# Patient Record
Sex: Male | Born: 1970 | Race: White | Hispanic: No | Marital: Married | Smoking: Never smoker
Health system: Southern US, Community
[De-identification: ages and names within clinical notes are randomized; demographics above are authoritative.]

## PROBLEM LIST (undated history)

## (undated) DIAGNOSIS — M25529 Pain in unspecified elbow: Secondary | ICD-10-CM

## (undated) DIAGNOSIS — E781 Pure hyperglyceridemia: Secondary | ICD-10-CM

## (undated) DIAGNOSIS — B019 Varicella without complication: Secondary | ICD-10-CM

## (undated) DIAGNOSIS — U071 COVID-19: Secondary | ICD-10-CM

## (undated) DIAGNOSIS — R519 Headache, unspecified: Secondary | ICD-10-CM

## (undated) DIAGNOSIS — G43909 Migraine, unspecified, not intractable, without status migrainosus: Secondary | ICD-10-CM

## (undated) DIAGNOSIS — M1990 Unspecified osteoarthritis, unspecified site: Secondary | ICD-10-CM

## (undated) DIAGNOSIS — R51 Headache: Secondary | ICD-10-CM

## (undated) HISTORY — PX: COLONOSCOPY: SHX174

## (undated) HISTORY — DX: Unspecified osteoarthritis, unspecified site: M19.90

## (undated) HISTORY — DX: Migraine, unspecified, not intractable, without status migrainosus: G43.909

## (undated) HISTORY — DX: Pain in unspecified elbow: M25.529

## (undated) HISTORY — DX: Headache: R51

## (undated) HISTORY — DX: Varicella without complication: B01.9

## (undated) HISTORY — DX: Headache, unspecified: R51.9

## (undated) HISTORY — DX: Pure hyperglyceridemia: E78.1

---

## 2007-10-29 ENCOUNTER — Ambulatory Visit: Payer: Self-pay | Admitting: Internal Medicine

## 2013-04-03 ENCOUNTER — Ambulatory Visit: Payer: Self-pay

## 2014-05-22 ENCOUNTER — Ambulatory Visit (INDEPENDENT_AMBULATORY_CARE_PROVIDER_SITE_OTHER): Payer: Commercial Managed Care - PPO | Admitting: Primary Care

## 2014-05-22 ENCOUNTER — Encounter: Payer: Self-pay | Admitting: Primary Care

## 2014-05-22 VITALS — BP 128/72 | HR 72 | Temp 98.1°F | Ht 70.5 in | Wt 236.4 lb

## 2014-05-22 DIAGNOSIS — G40909 Epilepsy, unspecified, not intractable, without status epilepticus: Secondary | ICD-10-CM

## 2014-05-22 DIAGNOSIS — E669 Obesity, unspecified: Secondary | ICD-10-CM | POA: Diagnosis not present

## 2014-05-22 DIAGNOSIS — Z86010 Personal history of colonic polyps: Secondary | ICD-10-CM | POA: Diagnosis not present

## 2014-05-22 HISTORY — DX: Epilepsy, unspecified, not intractable, without status epilepticus: G40.909

## 2014-05-22 LAB — COMPREHENSIVE METABOLIC PANEL
ALT: 39 U/L (ref 0–53)
AST: 20 U/L (ref 0–37)
Albumin: 4 g/dL (ref 3.5–5.2)
Alkaline Phosphatase: 58 U/L (ref 39–117)
BILIRUBIN TOTAL: 0.4 mg/dL (ref 0.2–1.2)
BUN: 14 mg/dL (ref 6–23)
CHLORIDE: 105 meq/L (ref 96–112)
CO2: 29 mEq/L (ref 19–32)
Calcium: 8.9 mg/dL (ref 8.4–10.5)
Creatinine, Ser: 0.91 mg/dL (ref 0.40–1.50)
GFR: 95.56 mL/min (ref >60.00)
GLUCOSE: 101 mg/dL — AB (ref 70–99)
POTASSIUM: 3.7 meq/L (ref 3.5–5.1)
Sodium: 138 mEq/L (ref 135–145)
Total Protein: 6.9 g/dL (ref 6.0–8.3)

## 2014-05-22 NOTE — Patient Instructions (Addendum)
Complete lab work prior to leaving today. I will notify you of your results. I will send refills to your pharmacy once I receive these results today. Let me know if the clotrimazole cream does not help to heal your rash. It was a pleasure to meet you today! Please don't hesitate to call me with any questions. Welcome to [facility]!

## 2014-05-22 NOTE — Assessment & Plan Note (Addendum)
Endorses mostly healthy diet. Recently started exercising again at the gym. Will obtain old records regarding lipid panel, etc.

## 2014-05-22 NOTE — Progress Notes (Signed)
Pre visit review using our clinic review tool, if applicable. No additional management support is needed unless otherwise documented below in the visit note. 

## 2014-05-22 NOTE — Progress Notes (Signed)
Subjective:    Patient ID: Vincent Padilla, male    DOB: Dec 01, 1970, 44 y.o.   MRN: [id number]  HPI  Vincent Padilla is a 44 year old male who presents today to establish care and discuss the problems mentioned below. Will obtain old records.  Was established with Dr. Derenda Mis but cannot get into his office for an appointment. He was seen in his office two months ago but reports he didn't have blood   1) Seizure disorder: Diagnosed since 1986. Last seizure was May 30th of 1997. He's maintained on carbamazepine 200 mg three times daily. This dose has never been adjusted. He was once trialed to wean off in 1992 but was unsuccessful in that his seizures returned. He is out of his medication and is requesting refills.  2) Frequent headaches: Present twice weekly, during stress, takes nothing usually but will take ibuprofen if the pain becomes intense.  3) Polyps: Last colonoscopy 2014, 5 present, removed and were benign. Due for repeat in 5 years, 2019. Denies bloody stools, pain.  4) Obesity: Exercising at the gym 2 days a week, started back recently. Diet consists of occasional juicing, grilled meats, fresh fruits and vegetables, some sweets. Drinks mostly water, juice, sodas.  5) Rash: Present to left arm, posterior trunk, and neck intermittently for a couple of months, does not itch and is not painful. He has purchased Clotrimazole OTC but has not started.   Review of Systems  Constitutional: Negative for fatigue and unexpected weight change.  Respiratory: Negative for shortness of breath.   Cardiovascular: Negative for chest pain.  Gastrointestinal: Negative for abdominal pain, diarrhea, constipation and blood in stool.  Genitourinary: Negative for dysuria and frequency.  Musculoskeletal: Negative for myalgias and arthralgias.  Skin: Positive for rash.  Allergic/Immunologic: Negative for environmental allergies.  Neurological:       Denies headaches today.  Psychiatric/Behavioral:   Denies concerns for anxiety or depression       Past Medical History  Diagnosis Date  . Migraine   . Frequent headaches   . Chicken pox   . Arthritis     Present to neck    History   Social History  . Marital Status: Married    Spouse Name: N/A  . Number of Children: N/A  . Years of Education: N/A   Occupational History  . Not on file.   Social History Main Topics  . Smoking status: Never Smoker   . Smokeless tobacco: Not on file  . Alcohol Use: No  . Drug Use: Not on file  . Sexual Activity: Not on file   Other Topics Concern  . Not on file   Social History Narrative   Married.   Works as Conservation officer, nature.   Completed some college.   Enjoys Associate Professor guns, black smithing, working on cars.       History reviewed. No pertinent past surgical history.  Family History  Problem Relation Age of Onset  . Cancer Brother 63    colon, deceased  . Cancer Paternal Grandfather   . Cancer Father     No Known Allergies  No current outpatient prescriptions on file prior to visit.   No current facility-administered medications on file prior to visit.    BP 128/72 mmHg  Pulse 72  Temp(Src) 98.1 F (36.7 C) (Oral)  Ht 5' 10.5" (1.791 m)  Wt 236 lb 6.4 oz (107.23 kg)  BMI 33.43 kg/m2  SpO2 92%    Objective:   Physical Exam  Constitutional: He is oriented to person, place, and time. He appears well-developed.  HENT:  Right Ear: Tympanic membrane and ear canal normal.  Left Ear: Tympanic membrane and ear canal normal.  Eyes: Conjunctivae and EOM are normal. Pupils are equal, round, and reactive to light.  Neck: Neck supple.  Cardiovascular: Normal rate and regular rhythm.   Pulmonary/Chest: Effort normal and breath sounds normal.  Abdominal: Soft. Bowel sounds are normal.  Lymphadenopathy:    He has no cervical adenopathy.  Neurological: He is alert and oriented to person, place, and time. He has normal reflexes. No cranial nerve deficit.  Skin: Skin is  warm and dry.  Rash present to right side of posterior trunk, left and right upper extremity. Appears to be fungal. No s/s of infection. Skin intact.  Psychiatric: He has a normal mood and affect.          Assessment & Plan:  Rash:  Appears fungal. No itching or pain. Present to posterior trunk and bilateral upper extremities. Present for several months. He has Clotrimazole OTC but has not tried.  Follow up if no improvement from Clotrimazole.

## 2014-05-22 NOTE — Assessment & Plan Note (Signed)
Brother deceased at 45 with colon cancer. Last colonoscopy 2014, removed 5 polyps, benign Follow up in 2019.

## 2014-05-22 NOTE — Assessment & Plan Note (Signed)
Managed on carbamazepine 200 mg TID since 90's. Requesting refills today. Will obtain carbamazepine blood levels and LFT's today. If normal will refill.  Will obtain old records.

## 2014-05-23 ENCOUNTER — Other Ambulatory Visit: Payer: Self-pay | Admitting: Primary Care

## 2014-05-23 DIAGNOSIS — G40909 Epilepsy, unspecified, not intractable, without status epilepticus: Secondary | ICD-10-CM

## 2014-05-23 LAB — CARBAMAZEPINE LEVEL, TOTAL: CARBAMAZEPINE LVL: 5.4 ug/mL (ref 4.0–12.0)

## 2014-05-23 MED ORDER — CARBAMAZEPINE 200 MG PO TABS: 200.0000 mg | ORAL_TABLET | Freq: Three times a day (TID) | ORAL | Status: DC

## 2014-09-01 ENCOUNTER — Encounter: Payer: Commercial Managed Care - PPO | Admitting: Primary Care

## 2014-09-04 ENCOUNTER — Encounter: Payer: Self-pay | Admitting: *Deleted

## 2014-09-04 ENCOUNTER — Other Ambulatory Visit: Payer: Self-pay | Admitting: Primary Care

## 2014-09-04 ENCOUNTER — Ambulatory Visit (INDEPENDENT_AMBULATORY_CARE_PROVIDER_SITE_OTHER): Payer: Commercial Managed Care - PPO | Admitting: Primary Care

## 2014-09-04 VITALS — BP 118/76 | HR 80 | Temp 97.5°F | Ht 71.0 in | Wt 235.8 lb

## 2014-09-04 DIAGNOSIS — G40909 Epilepsy, unspecified, not intractable, without status epilepticus: Secondary | ICD-10-CM | POA: Diagnosis not present

## 2014-09-04 DIAGNOSIS — E781 Pure hyperglyceridemia: Secondary | ICD-10-CM

## 2014-09-04 DIAGNOSIS — R7989 Other specified abnormal findings of blood chemistry: Secondary | ICD-10-CM | POA: Diagnosis not present

## 2014-09-04 DIAGNOSIS — E66811 Obesity, class 1: Secondary | ICD-10-CM

## 2014-09-04 DIAGNOSIS — Z0000 Encounter for general adult medical examination without abnormal findings: Secondary | ICD-10-CM | POA: Diagnosis not present

## 2014-09-04 DIAGNOSIS — E669 Obesity, unspecified: Secondary | ICD-10-CM

## 2014-09-04 LAB — CBC
HCT: 44.4 % (ref 39.0–52.0)
Hemoglobin: 15.4 g/dL (ref 13.0–17.0)
MCHC: 34.6 g/dL (ref 30.0–36.0)
MCV: 88.3 fl (ref 78.0–100.0)
Platelets: 218 10*3/uL (ref 150.0–400.0)
RBC: 5.03 Mil/uL (ref 4.22–5.81)
RDW: 12.7 % (ref 11.5–15.5)
WBC: 5.9 10*3/uL (ref 4.0–10.5)

## 2014-09-04 LAB — COMPREHENSIVE METABOLIC PANEL
ALT: 28 U/L (ref 0–53)
AST: 19 U/L (ref 0–37)
Albumin: 4.2 g/dL (ref 3.5–5.2)
Alkaline Phosphatase: 57 U/L (ref 39–117)
BUN: 14 mg/dL (ref 6–23)
CO2: 30 meq/L (ref 19–32)
CREATININE: 1.02 mg/dL (ref 0.40–1.50)
Calcium: 9.1 mg/dL (ref 8.4–10.5)
Chloride: 105 mEq/L (ref 96–112)
GFR: 83.66 mL/min (ref >60.00)
Glucose, Bld: 106 mg/dL — ABNORMAL HIGH (ref 70–99)
Potassium: 4.1 mEq/L (ref 3.5–5.1)
Sodium: 142 mEq/L (ref 135–145)
Total Bilirubin: 0.4 mg/dL (ref 0.2–1.2)
Total Protein: 7 g/dL (ref 6.0–8.3)

## 2014-09-04 LAB — LDL CHOLESTEROL, DIRECT: Direct LDL: 41 mg/dL

## 2014-09-04 LAB — LIPID PANEL
Cholesterol: 183 mg/dL (ref 0–200)
HDL: 27.3 mg/dL — ABNORMAL LOW (ref >39.00)
Total CHOL/HDL Ratio: 7
Triglycerides: 607 mg/dL — ABNORMAL HIGH (ref 0.0–149.0)

## 2014-09-04 LAB — HEMOGLOBIN A1C: Hgb A1c MFr Bld: 5.1 % (ref 4.6–6.5)

## 2014-09-04 LAB — TSH: TSH: 1.51 u[IU]/mL (ref 0.35–4.50)

## 2014-09-04 NOTE — Assessment & Plan Note (Signed)
Discussed the importance of healthy diet and exercise. Will do labs today. Suggestions provided to patient regarding healthy food choices.

## 2014-09-04 NOTE — Assessment & Plan Note (Signed)
Stable. Last carbamazepine level drawn in May 2016 and normal.  Managed on carbamazepine 200 mg. Will continue to monitor.

## 2014-09-04 NOTE — Progress Notes (Signed)
Pre visit review using our clinic review tool, if applicable. No additional management support is needed unless otherwise documented below in the visit note. 

## 2014-09-04 NOTE — Progress Notes (Signed)
Subjective:    Patient ID: Vincent Padilla, male    DOB: 30-Aug-1970, 44 y.o.   MRN: [id number]  HPI  Vincent Padilla is a 44 year old male who presents today for complete physical.  Immunizations: -Tetanus: Believes it's been within 10 years. -Influenza: Did not receive last season.   Diet:  Breakfast: Cereal, oatmeal Lunch: Skips some, fast food (taco bell, chicken sandwich) Dinner: Spaghetti, casseroles, sandwiches Desserts: Ice cream Beverages: Gatorade, water, occasional soda Exercise: Active lifestyle. Exercising at the gym 2-3 times weekly 1 hour some weeks. Eye exam: Due, plans to schedule.  Dental exam: Unknown when last completed.  Colonoscopy: Completed in 04-21-12 with colonic polyps, due again in 21-Apr-2017. Brother deceased due to colon cancer.  1) Seizure disorder: Managed on carbamazepine 200 mg for many years. Denies recent seizure.   Review of Systems  Constitutional: Negative for unexpected weight change.  HENT: Negative for rhinorrhea.   Respiratory: Negative for cough and shortness of breath.   Cardiovascular: Negative for chest pain.  Gastrointestinal: Negative for diarrhea and constipation.  Genitourinary: Negative for difficulty urinating.  Musculoskeletal: Negative for myalgias and arthralgias.  Skin: Negative for rash.  Neurological: Negative for dizziness, numbness and headaches.  Psychiatric/Behavioral:       Denies concerns for anxiety or depression       Past Medical History  Diagnosis Date  . Migraine   . Frequent headaches   . Chicken pox   . Arthritis     Present to neck    Social History   Social History  . Marital Status: Married    Spouse Name: N/A  . Number of Children: N/A  . Years of Education: N/A   Occupational History  . Not on file.   Social History Main Topics  . Smoking status: Never Smoker   . Smokeless tobacco: Not on file  . Alcohol Use: No  . Drug Use: Not on file  . Sexual Activity: Not on file   Other Topics  Concern  . Not on file   Social History Narrative   Married.   Works as Conservation officer, nature.   Completed some college.   Enjoys Associate Professor guns, black smithing, working on cars.       No past surgical history on file.  Family History  Problem Relation Age of Onset  . Cancer Brother 79    colon, deceased  . Cancer Paternal Grandfather   . Cancer Father     No Known Allergies  Current Outpatient Prescriptions on File Prior to Visit  Medication Sig Dispense Refill  . carbamazepine (TEGRETOL) 200 MG tablet Take 1 tablet (200 mg total) by mouth 3 (three) times daily. 90 tablet 5   No current facility-administered medications on file prior to visit.    BP 118/76 mmHg  Pulse 80  Temp(Src) 97.5 F (36.4 C) (Oral)  Ht 5\' 11"  (1.803 m)  Wt 235 lb 12.8 oz (106.958 kg)  BMI 32.90 kg/m2  SpO2 94%    Objective:   Physical Exam  Constitutional: He is oriented to person, place, and time. He appears well-nourished.  HENT:  Right Ear: Tympanic membrane and ear canal normal.  Left Ear: Tympanic membrane and ear canal normal.  Nose: Nose normal.  Mouth/Throat: Oropharynx is clear and moist.  Eyes: Conjunctivae and EOM are normal. Pupils are equal, round, and reactive to light.  Neck: Neck supple.  Cardiovascular: Normal rate and regular rhythm.   Pulmonary/Chest: Effort normal and breath sounds normal.  Abdominal: Soft.  Bowel sounds are normal. There is no tenderness.  Musculoskeletal: Normal range of motion.  Lymphadenopathy:    He has no cervical adenopathy.  Neurological: He is alert and oriented to person, place, and time. He has normal reflexes. No cranial nerve deficit.  Skin: Skin is warm and dry.  Psychiatric: He has a normal mood and affect.          Assessment & Plan:

## 2014-09-04 NOTE — Assessment & Plan Note (Signed)
Tetanus up to date per patient. Nothing in [facility]. Labs today and pending. Exam unremarkable. Discussed the importance of healthy diet and regular exercise. Will schedule eye and dental exams. Follow up in 1 year for repeat physical

## 2014-09-04 NOTE — Patient Instructions (Signed)
Complete lab work prior to leaving today. I will notify you of your results.  It is important that you improve your diet. Please limit carbohydrates in the form of white bread, rice, pasta, cakes, cookies, sugary drinks, etc. Increase your consumption of fresh fruits and vegetables. Be sure to drink plenty of water daily.  You should be getting 1 hour of moderate intensity exercise 5 days a week.   Follow up in 1 year for repeat physical or sooner if needed.  It was a pleasure to see you today!

## 2014-12-04 ENCOUNTER — Other Ambulatory Visit (INDEPENDENT_AMBULATORY_CARE_PROVIDER_SITE_OTHER): Payer: Commercial Managed Care - PPO

## 2014-12-04 DIAGNOSIS — E781 Pure hyperglyceridemia: Secondary | ICD-10-CM

## 2014-12-04 LAB — LIPID PANEL
CHOL/HDL RATIO: 8
CHOLESTEROL: 214 mg/dL — AB (ref 0–200)
HDL: 26.1 mg/dL — AB (ref >39.00)
Triglycerides: 1309 mg/dL — ABNORMAL HIGH (ref 0.0–149.0)

## 2014-12-04 LAB — LDL CHOLESTEROL, DIRECT: Direct LDL: 44 mg/dL

## 2014-12-07 ENCOUNTER — Encounter: Payer: Self-pay | Admitting: *Deleted

## 2014-12-07 ENCOUNTER — Encounter: Payer: Self-pay | Admitting: Primary Care

## 2014-12-07 ENCOUNTER — Other Ambulatory Visit: Payer: Self-pay | Admitting: Primary Care

## 2014-12-07 DIAGNOSIS — E781 Pure hyperglyceridemia: Secondary | ICD-10-CM

## 2014-12-07 MED ORDER — EZETIMIBE 10 MG PO TABS: 10.0000 mg | ORAL_TABLET | Freq: Every day | ORAL | Status: DC

## 2015-02-15 ENCOUNTER — Other Ambulatory Visit: Payer: Self-pay | Admitting: Primary Care

## 2015-02-15 NOTE — Telephone Encounter (Signed)
Electronically refill request for   carbamazepine (TEGRETOL) 200 MG tablet   Take 1 tablet (200 mg total) by mouth 3 (three) times daily.  Dispense: 90 tablet   Refills: 5     Last prescribed on 05/23/2014. Last seen on 09/04/2014. CPE appt on 09/07/2015.

## 2015-09-07 ENCOUNTER — Telehealth: Payer: Self-pay | Admitting: Primary Care

## 2015-09-07 ENCOUNTER — Encounter: Payer: Commercial Managed Care - PPO | Admitting: Primary Care

## 2015-09-07 NOTE — Telephone Encounter (Signed)
Left message for pt to return call to rs appt

## 2015-09-07 NOTE — Telephone Encounter (Signed)
Please reschedule at his convenience 

## 2015-09-07 NOTE — Telephone Encounter (Signed)
Patient did not come in for their appointment today for cpe.  Please let me know if patient needs to be contacted immediately for follow up or no follow up needed. °

## 2015-09-10 NOTE — Telephone Encounter (Signed)
Pt can not reschedule appt for cpe, has no insurance at the time

## 2016-06-08 ENCOUNTER — Encounter: Payer: Self-pay | Admitting: Primary Care

## 2016-06-08 ENCOUNTER — Ambulatory Visit (INDEPENDENT_AMBULATORY_CARE_PROVIDER_SITE_OTHER): Payer: Self-pay | Admitting: Primary Care

## 2016-06-08 VITALS — BP 122/76 | HR 58 | Temp 98.4°F | Ht 70.5 in | Wt 204.1 lb

## 2016-06-08 DIAGNOSIS — E785 Hyperlipidemia, unspecified: Secondary | ICD-10-CM

## 2016-06-08 DIAGNOSIS — G40909 Epilepsy, unspecified, not intractable, without status epilepticus: Secondary | ICD-10-CM

## 2016-06-08 DIAGNOSIS — E781 Pure hyperglyceridemia: Secondary | ICD-10-CM | POA: Insufficient documentation

## 2016-06-08 HISTORY — DX: Hyperlipidemia, unspecified: E78.5

## 2016-06-08 LAB — LIPID PANEL
CHOLESTEROL: 192 mg/dL (ref 0–200)
HDL: 38.8 mg/dL — AB (ref >39.00)
LDL Cholesterol: 134 mg/dL — ABNORMAL HIGH (ref 0–99)
NonHDL: 153.12
TRIGLYCERIDES: 98 mg/dL (ref 0.0–149.0)
Total CHOL/HDL Ratio: 5
VLDL: 19.6 mg/dL (ref 0.0–40.0)

## 2016-06-08 LAB — COMPREHENSIVE METABOLIC PANEL
ALBUMIN: 4.4 g/dL (ref 3.5–5.2)
ALK PHOS: 56 U/L (ref 39–117)
ALT: 25 U/L (ref 0–53)
AST: 17 U/L (ref 0–37)
BUN: 17 mg/dL (ref 6–23)
CALCIUM: 8.9 mg/dL (ref 8.4–10.5)
CHLORIDE: 108 meq/L (ref 96–112)
CO2: 25 mEq/L (ref 19–32)
Creatinine, Ser: 0.85 mg/dL (ref 0.40–1.50)
GFR: 102.46 mL/min (ref >60.00)
Glucose, Bld: 114 mg/dL — ABNORMAL HIGH (ref 70–99)
POTASSIUM: 3.8 meq/L (ref 3.5–5.1)
Sodium: 139 mEq/L (ref 135–145)
TOTAL PROTEIN: 6.8 g/dL (ref 6.0–8.3)
Total Bilirubin: 0.4 mg/dL (ref 0.2–1.2)

## 2016-06-08 MED ORDER — CARBAMAZEPINE 200 MG PO TABS
200.0000 mg | ORAL_TABLET | Freq: Three times a day (TID) | ORAL | 3 refills | Status: DC
Start: 2016-06-08 — End: 2017-12-03

## 2016-06-08 NOTE — Progress Notes (Signed)
Subjective:    Patient ID: Vincent Padilla, male    DOB: 11-09-1970, 46 y.o.   MRN: [id number]  HPI  Vincent Padilla is a 46 year old male who presents today for medication refill.  1) Seizure Disorder: History of seizure disorder and is currently managed on carbamazepine 200 mg for which he's taken since 1986. His last seizure was May 30th of 1997 when he attempted to wean off of his medication in the past. He is needing are refill of his medication today.   He denies seizures, dizziness. He ran out of his medication in mid 2017 and has been taking his wife's medication since.   2) Hyperlipidemia: Last evaluation in our office was in November 2016. TC of 214, Trigs at 1309. He was encouraged to start Zetia and return to our office for re-evaluation. He never picked up the Zetia as it was too expensive and has not been back for re-evaluation since.  He's been taking Krill [employer] and has changed his diet drastically since January 2018. He's been fasting during the day, has cut back on sodas, cut back on sugar, and eats less junk food.   Wt Readings from Last 3 Encounters:  06/08/16 204 lb 1.9 oz (92.6 kg)  09/04/14 235 lb 12.8 oz (107 kg)  05/22/14 236 lb 6.4 oz (107.2 kg)     Review of Systems  Constitutional: Negative for fatigue.  Eyes: Negative for visual disturbance.  Respiratory: Negative for shortness of breath.   Cardiovascular: Negative for chest pain and leg swelling.  Neurological: Negative for dizziness, seizures, weakness and headaches.       Past Medical History:  Diagnosis Date  . Arthritis    Present to neck  . Chicken pox   . Frequent headaches   . Hypertriglyceridemia   . Migraine      Social History   Social History  . Marital status: Married    Spouse name: N/A  . Number of children: N/A  . Years of education: N/A   Occupational History  . Not on file.   Social History Main Topics  . Smoking status: Never Smoker  . Smokeless tobacco: Never Used    . Alcohol use No  . Drug use: Unknown  . Sexual activity: Not on file   Other Topics Concern  . Not on file   Social History Narrative   Married.   Works as Conservation officer, nature.   Completed some college.   Enjoys Associate Professor guns, black smithing, working on cars.       No past surgical history on file.  Family History  Problem Relation Age of Onset  . Cancer Brother 10       colon, deceased  . Cancer Paternal Grandfather   . Cancer Father     No Known Allergies  No current outpatient prescriptions on file prior to visit.   No current facility-administered medications on file prior to visit.     BP 122/76   Pulse (!) 58   Temp 98.4 F (36.9 C) (Oral)   Ht 5' 10.5" (1.791 m)   Wt 204 lb 1.9 oz (92.6 kg)   SpO2 97%   BMI 28.87 kg/m    Objective:   Physical Exam  Constitutional: He is oriented to person, place, and time. He appears well-nourished.  Eyes: EOM are normal. Pupils are equal, round, and reactive to light.  Cardiovascular: Normal rate and regular rhythm.   Pulmonary/Chest: Effort normal and breath sounds normal.  Neurological: He  is alert and oriented to person, place, and time. He has normal reflexes. No cranial nerve deficit. Coordination normal.          Assessment & Plan:

## 2016-06-08 NOTE — Patient Instructions (Signed)
Complete lab work prior to leaving today. I will notify you of your results once received.   I sent refills of your medication to your pharmacy.  Continue to work on improvements in your diet.   Start exercising. You should be getting 150 minutes of moderate intensity exercise weekly.  It was a pleasure to see you today!  Food Choices to Lower Your Triglycerides Triglycerides are a type of fat in your blood. High levels of triglycerides can increase the risk of heart disease and stroke. If your triglyceride levels are high, the foods you eat and your eating habits are very important. Choosing the right foods can help lower your triglycerides. What general guidelines do I need to follow?  Lose weight if you are overweight.  Limit or avoid alcohol.  Fill one half of your plate with vegetables and green salads.  Limit fruit to two servings a day. Choose fruit instead of juice.  Make one fourth of your plate whole grains. Look for the word "whole" as the first word in the ingredient list.  Fill one fourth of your plate with lean protein foods.  Enjoy fatty fish (such as salmon, mackerel, sardines, and tuna) three times a week.  Choose healthy fats.  Limit foods high in starch and sugar.  Eat more home-cooked food and less restaurant, buffet, and fast food.  Limit fried foods.  Cook foods using methods other than frying.  Limit saturated fats.  Check ingredient lists to avoid foods with partially hydrogenated oils (trans fats) in them. What foods can I eat? Grains  Whole grains, such as whole wheat or whole grain breads, crackers, cereals, and pasta. Unsweetened oatmeal, bulgur, barley, quinoa, or brown rice. Corn or whole wheat flour tortillas. Vegetables  Fresh or frozen vegetables (raw, steamed, roasted, or grilled). Green salads. Fruits  All fresh, canned (in natural juice), or frozen fruits. Meat and Other Protein Products  Ground beef (85% or leaner), grass-fed beef,  or beef trimmed of fat. Skinless chicken or [place]. Ground chicken or [place]. Pork trimmed of fat. All fish and seafood. Eggs. Dried beans, peas, or lentils. Unsalted nuts or seeds. Unsalted canned or dry beans. Dairy  Low-fat dairy products, such as skim or 1% milk, 2% or reduced-fat cheeses, low-fat ricotta or cottage cheese, or plain low-fat yogurt. Fats and Oils  Tub margarines without trans fats. Light or reduced-fat mayonnaise and salad dressings. Avocado. Safflower, olive, or canola oils. Natural peanut or almond butter. The items listed above may not be a complete list of recommended foods or beverages. Contact your dietitian for more options.  What foods are not recommended? Grains  White bread. White pasta. White rice. Cornbread. Bagels, pastries, and croissants. Crackers that contain trans fat. Vegetables  White potatoes. Corn. Creamed or fried vegetables. Vegetables in a cheese sauce. Fruits  Dried fruits. Canned fruit in light or heavy syrup. Fruit juice. Meat and Other Protein Products  Fatty cuts of meat. Ribs, chicken wings, bacon, sausage, bologna, salami, chitterlings, fatback, hot dogs, bratwurst, and packaged luncheon meats. Dairy  Whole or 2% milk, cream, half-and-half, and cream cheese. Whole-fat or sweetened yogurt. Full-fat cheeses. Nondairy creamers and whipped toppings. Processed cheese, cheese spreads, or cheese curds. Sweets and Desserts  Corn syrup, sugars, honey, and molasses. Candy. Jam and jelly. Syrup. Sweetened cereals. Cookies, pies, cakes, donuts, muffins, and ice cream. Fats and Oils  Butter, stick margarine, lard, shortening, ghee, or bacon fat. Coconut, palm kernel, or palm oils. Beverages  Alcohol. Sweetened drinks (such  as sodas, lemonade, and fruit drinks or punches). The items listed above may not be a complete list of foods and beverages to avoid. Contact your dietitian for more information.  This information is not intended to replace advice given  to you by your health care provider. Make sure you discuss any questions you have with your health care provider. Document Released: 10/21/2003 Document Revised: 06/10/2015 Document Reviewed: 11/06/2012 Elsevier Interactive Patient Education  2017 [employer].

## 2016-06-08 NOTE — Assessment & Plan Note (Signed)
History of hypertriglyceridemia that was discovered in 2016, never followed up as recommended. Will repeat lipids today as he is fasting. Consider fenofibrate vs statin for treatment if necessary. Commended him on weight loss through improvements in lifestyle.

## 2016-06-08 NOTE — Assessment & Plan Note (Signed)
Check Carbamezapine level today.  Refills sent to pharmacy.

## 2016-06-09 ENCOUNTER — Encounter: Payer: Self-pay | Admitting: *Deleted

## 2016-06-09 ENCOUNTER — Other Ambulatory Visit: Payer: Self-pay | Admitting: Primary Care

## 2016-06-09 DIAGNOSIS — R739 Hyperglycemia, unspecified: Secondary | ICD-10-CM

## 2016-06-09 LAB — CARBAMAZEPINE LEVEL, TOTAL: Carbamazepine Lvl: 4.1 mg/L (ref 4.0–12.0)

## 2016-06-13 ENCOUNTER — Other Ambulatory Visit (INDEPENDENT_AMBULATORY_CARE_PROVIDER_SITE_OTHER): Payer: Self-pay

## 2016-06-13 DIAGNOSIS — R739 Hyperglycemia, unspecified: Secondary | ICD-10-CM

## 2016-06-13 LAB — HEMOGLOBIN A1C: HEMOGLOBIN A1C: 5.5 % (ref 4.6–6.5)

## 2017-10-04 ENCOUNTER — Encounter: Payer: Self-pay | Admitting: Family Medicine

## 2017-10-04 ENCOUNTER — Ambulatory Visit: Payer: Self-pay | Admitting: Family Medicine

## 2017-10-04 VITALS — BP 132/72 | HR 59 | Temp 98.3°F | Wt 230.8 lb

## 2017-10-04 DIAGNOSIS — M25529 Pain in unspecified elbow: Secondary | ICD-10-CM

## 2017-10-04 NOTE — Patient Instructions (Signed)
Keep icing and take ibuprofen with food. 2 tabs up to 3 times a day.  Get a tennis elbow strap and use that.  If not better, then ask about seeing Dr. Lorelei Pont. Take care.  Glad to see you.

## 2017-10-04 NOTE — Progress Notes (Signed)
Was lifting/moving a transmission.  Then had elbow pain after that.  This was about 1 month ago.  Can still move L elbow.  Initially with antecubital area pain but now with pain near the lateral epicondyle.  He is L handed.  Some ache at rest, worse pain with lifting.  Pain with pronation and supination.  No specific injury o/w, wasn't hit/pinned.    Has been taking ibuprofen episodically.   Had used an elbow strap but not a tennis elbow strap.  Has been icing, with less relief now.    Meds, vitals, and allergies reviewed.   ROS: Per HPI unless specifically indicated in ROS section   nad ncat Normal left shoulder elbow and wrist range of motion.  Normal grip.  Distally neurovascular intact with normal sensation and normal radial pulse.  He is not tender on the olecranon, antecubital area, medial epicondyle.  He has minimal tenderness near the lateral epicondyles.  He thought his lower level of pain today on exam was related to limiting lifting yesterday.  No bruising.  No rash.

## 2017-10-05 DIAGNOSIS — M25529 Pain in unspecified elbow: Secondary | ICD-10-CM | POA: Insufficient documentation

## 2017-10-05 HISTORY — DX: Pain in unspecified elbow: M25.529

## 2017-10-05 NOTE — Assessment & Plan Note (Signed)
Unlikely to have a fracture and unlikely to benefit from imaging at this point.  Discussed with patient about options.  Likely tendinitis related to overuse/strain.  He can still try a tennis elbow strap as this may be useful.  Continue icing along with using tennis elbow strap.  Take ibuprofen with routine cautions.  If not better I would like him to see Dr. Lorelei Pont.  Rationale discussed with patient.  He agrees.

## 2017-10-25 ENCOUNTER — Encounter: Payer: Self-pay | Admitting: Family Medicine

## 2017-10-25 ENCOUNTER — Ambulatory Visit: Payer: Self-pay | Admitting: Family Medicine

## 2017-10-25 VITALS — BP 104/72 | HR 65 | Temp 98.4°F | Ht 70.25 in | Wt 233.0 lb

## 2017-10-25 DIAGNOSIS — M7918 Myalgia, other site: Secondary | ICD-10-CM

## 2017-10-25 MED ORDER — PREDNISONE 20 MG PO TABS: ORAL_TABLET | ORAL | 0 refills | Status: DC

## 2017-10-25 NOTE — Progress Notes (Signed)
.tem   Dr. Frederico Hamman T. Kaitlan Bin, MD, [facility] [place], [address] Phone: [phone] Fax: [phone]  10/25/2017  Patient: Vincent Padilla, MRN: [id number], DOB: January 02, 1971, 47 y.o.  Primary Physician:  [name], NP   Chief Complaint  Patient presents with  . Elbow Pain    Left   Subjective:   KAEVON COTTA is a 47 y.o. very pleasant male patient who presents with the following:  May was lifting some paver blocks in May 2019. Was doing some lifting and it really hurt a lot in the antecubital fossa. Neoprene brace did help some and arm hurting a lot. Saw Dr. Keturah Barre and tried a tennis elbow brace - this did not help. It made it worse.   His pain is deep in the antecubital fossa and has no pain at the LE or ME. No numbness or tingling.  Supination hurts a lot. Also deep antecubital fossa.   Mountain climbers elbow Supinator syndrome  Past Medical History, Surgical History, Social History, Family History, Problem List, Medications, and Allergies have been reviewed and updated if relevant.  Patient Active Problem List   Diagnosis Date Noted  . Elbow pain 10/05/2017  . Hyperlipidemia 06/08/2016  . Preventative health care 09/04/2014  . Seizure disorder ([place]) 05/22/2014  . History of colonic polyps 05/22/2014  . Obesity (BMI 30.0-34.9) 05/22/2014    Past Medical History:  Diagnosis Date  . Arthritis    Present to neck  . Chicken pox   . Frequent headaches   . Hypertriglyceridemia   . Migraine     History reviewed. No pertinent surgical history.  Social History   Socioeconomic History  . Marital status: Married    Spouse name: Not on file  . Number of children: Not on file  . Years of education: Not on file  . Highest education level: Not on file  Occupational History  . Not on file  Social Needs  . Financial resource strain: Not on file  . Food insecurity:    Worry: Not on file   Inability: Not on file  . Transportation needs:    Medical: Not on file    Non-medical: Not on file  Tobacco Use  . Smoking status: Never Smoker  . Smokeless tobacco: Never Used  Substance and Sexual Activity  . Alcohol use: No    Alcohol/week: 0.0 standard drinks  . Drug use: Not on file  . Sexual activity: Not on file  Lifestyle  . Physical activity:    Days per week: Not on file    Minutes per session: Not on file  . Stress: Not on file  Relationships  . Social connections:    Talks on phone: Not on file    Gets together: Not on file    Attends religious service: Not on file    Active member of club or organization: Not on file    Attends meetings of clubs or organizations: Not on file    Relationship status: Not on file  . Intimate partner violence:    Fear of current or ex partner: Not on file    Emotionally abused: Not on file    Physically abused: Not on file    Forced sexual activity: Not on file  Other Topics Concern  . Not on file  Social History Narrative   Married.   Works as Conservation officer, nature.   Completed some college.   Enjoys Designer, fashion/clothing, black  smithing, working on cars.    Family History  Problem Relation Age of Onset  . Cancer Brother 27       colon, deceased  . Cancer Paternal Grandfather   . Cancer Father     No Known Allergies  Medication list reviewed and updated in full in [place].  GEN: No fevers, chills. Nontoxic. Primarily MSK c/o today. MSK: Detailed in the HPI GI: tolerating PO intake without difficulty Neuro: No numbness, parasthesias, or tingling associated. Otherwise the pertinent positives of the ROS are noted above.   Objective:   BP 104/72   Pulse 65   Temp 98.4 F (36.9 C) (Oral)   Ht 5' 10.25" (1.784 m)   Wt 233 lb (105.7 kg)   BMI 33.19 kg/m    GEN: WDWN, NAD, Non-toxic, Alert & Oriented x 3 HEENT: Atraumatic, Normocephalic.  Ears and Nose: No external deformity. EXTR: No  clubbing/cyanosis/edema NEURO: Normal gait.  PSYCH: Normally interactive. Conversant. Not depressed or anxious appearing.  Calm demeanor.   L elbow Ecchymosis or edema: neg ROM: full flexion, extension, pronation, supination Shoulder ROM: Full Flexion: 5/5, but speeds is pos Extension: 5/5 Supination: 4/5 - painful Pronation: 5/5 Wrist ext: 5/5 Wrist flexion: 5/5 No gross bony abnormality Varus and Valgus stress: stable Pain deep to the biceps tendon on each side.  ECRB tenderness: neg Medial epicondyle: NT Lateral epicondyle, resisted wrist extension from wrist full pronation and flexion: NT grip: 5/5  sensation intact Tinel's, Elbow: negative   Radiology: No results found.  Assessment and Plan:   Tenderness of brachioradialis muscle  Brachialis and brachioradialis involvement no LE  C/w mountain climbers elbow. I reviewed classic mountain climbers elbow with him.  10 d oral steroid course  Follow-up: 6 weeks if still having problems  Meds ordered this encounter  Medications  . predniSONE (DELTASONE) 20 MG tablet    Sig: 2 tabs po daily for 5 days, then 1 tab po daily for 5 days    Dispense:  15 tablet    Refill:  0   Signed,  Kendryck Lacroix T. Abigail Teall, MD   Allergies as of 10/25/2017   No Known Allergies     Medication List        Accurate as of 10/25/17 11:59 PM. Always use your most recent med list.          carbamazepine 200 MG tablet Commonly known as:  TEGRETOL Take 1 tablet (200 mg total) by mouth 3 (three) times daily.   predniSONE 20 MG tablet Commonly known as:  DELTASONE 2 tabs po daily for 5 days, then 1 tab po daily for 5 days

## 2017-12-03 ENCOUNTER — Other Ambulatory Visit: Payer: Self-pay | Admitting: Primary Care

## 2017-12-03 DIAGNOSIS — G40909 Epilepsy, unspecified, not intractable, without status epilepticus: Secondary | ICD-10-CM

## 2017-12-04 NOTE — Telephone Encounter (Signed)
Please notify patient that he needs needs to be seen for either CPE or follow up for further refills. We will provide him with a 30 day supply of his medication until he's seen.

## 2017-12-04 NOTE — Telephone Encounter (Signed)
Last prescribed on 06/08/2016 Last office visit with Dr Lorelei Pont on 10/25/2017 but last saw Allie Bossier on 06/08/2016

## 2017-12-12 NOTE — Telephone Encounter (Signed)
Talked to pt and he stated he will have his wife call the office to schedule appt.

## 2018-02-08 ENCOUNTER — Encounter: Payer: Self-pay | Admitting: Primary Care

## 2018-02-08 ENCOUNTER — Ambulatory Visit (INDEPENDENT_AMBULATORY_CARE_PROVIDER_SITE_OTHER): Payer: Self-pay | Admitting: Primary Care

## 2018-02-08 VITALS — BP 124/84 | HR 56 | Temp 98.2°F | Ht 70.25 in | Wt 240.5 lb

## 2018-02-08 DIAGNOSIS — E785 Hyperlipidemia, unspecified: Secondary | ICD-10-CM

## 2018-02-08 DIAGNOSIS — G40909 Epilepsy, unspecified, not intractable, without status epilepticus: Secondary | ICD-10-CM

## 2018-02-08 LAB — COMPREHENSIVE METABOLIC PANEL
ALK PHOS: 53 U/L (ref 39–117)
ALT: 22 U/L (ref 0–53)
AST: 16 U/L (ref 0–37)
Albumin: 4.3 g/dL (ref 3.5–5.2)
BILIRUBIN TOTAL: 0.5 mg/dL (ref 0.2–1.2)
BUN: 10 mg/dL (ref 6–23)
CALCIUM: 9 mg/dL (ref 8.4–10.5)
CO2: 30 meq/L (ref 19–32)
Chloride: 104 mEq/L (ref 96–112)
Creatinine, Ser: 0.91 mg/dL (ref 0.40–1.50)
GFR: 88.48 mL/min (ref >60.00)
Glucose, Bld: 113 mg/dL — ABNORMAL HIGH (ref 70–99)
Potassium: 3.7 mEq/L (ref 3.5–5.1)
Sodium: 140 mEq/L (ref 135–145)
TOTAL PROTEIN: 6.8 g/dL (ref 6.0–8.3)

## 2018-02-08 LAB — LIPID PANEL
CHOLESTEROL: 185 mg/dL (ref 0–200)
HDL: 33.9 mg/dL — AB (ref >39.00)
NonHDL: 150.92
TRIGLYCERIDES: 349 mg/dL — AB (ref 0.0–149.0)
Total CHOL/HDL Ratio: 5
VLDL: 69.8 mg/dL — ABNORMAL HIGH (ref 0.0–40.0)

## 2018-02-08 LAB — LDL CHOLESTEROL, DIRECT: Direct LDL: 68 mg/dL

## 2018-02-08 LAB — HEMOGLOBIN A1C: Hgb A1c MFr Bld: 5 % (ref 4.6–6.5)

## 2018-02-08 NOTE — Assessment & Plan Note (Signed)
Has regained most of his weight back since last visit. Discussed the importance of a healthy diet and regular exercise in order for weight loss, and to reduce the risk of any potential medical problems. Repeat lipids, A1C pending.

## 2018-02-08 NOTE — Progress Notes (Signed)
Subjective:    Patient ID: Vincent Padilla, male    DOB: December 06, 1970, 48 y.o.   MRN: [id number]  HPI  Vincent Padilla is a 48 year old male who presents today for medication refill.  1) Seizure Disorder: Currently managed on carbamazepine 200 mg for which he's taken since 1992. Last seizure was May 30th 1997 when he attempted to wean off of his medication.   Since his last visit he's missed doses of his medications at times. He has mostly been consistent with compliance.   2) Hyperlipidemia: Prior history of hypertriglyceridemia with trigs as high as 1300. Last lipid panel was in May 2018 with trigs of 98. During that time he had drastically changed his diet and was successful with weight loss.   Since his last visit he's gained weight, endorses a fair diet. He's no longer taking fish oil.   Diet currently consists of:  Breakfast: Skips Lunch: Skips sometimes, take out food Dinner: Chili, meat, vegetables, fast food  Snacks: None Desserts: 2-3 days weekly, smaller portions  Beverages: Water, some soda, occasional sweet tea  Exercise: He is not exercising, active at work   [facility] Readings from Last 3 Encounters:  02/08/18 240 lb 8 oz (109.1 kg)  10/25/17 233 lb (105.7 kg)  10/04/17 230 lb 12 oz (104.7 kg)   BP Readings from Last 3 Encounters:  02/08/18 124/84  10/25/17 104/72  10/04/17 132/72     Review of Systems  Eyes: Negative for visual disturbance.  Respiratory: Negative for shortness of breath.   Cardiovascular: Negative for chest pain.  Neurological: Negative for dizziness and seizures.       Past Medical History:  Diagnosis Date  . Arthritis    Present to neck  . Chicken pox   . Frequent headaches   . Hypertriglyceridemia   . Migraine      Social History   Socioeconomic History  . Marital status: Married    Spouse name: Not on file  . Number of children: Not on file  . Years of education: Not on file  . Highest education level: Not on file    Occupational History  . Not on file  Social Needs  . Financial resource strain: Not on file  . Food insecurity:    Worry: Not on file    Inability: Not on file  . Transportation needs:    Medical: Not on file    Non-medical: Not on file  Tobacco Use  . Smoking status: Never Smoker  . Smokeless tobacco: Never Used  Substance and Sexual Activity  . Alcohol use: No    Alcohol/week: 0.0 standard drinks  . Drug use: Not on file  . Sexual activity: Not on file  Lifestyle  . Physical activity:    Days per week: Not on file    Minutes per session: Not on file  . Stress: Not on file  Relationships  . Social connections:    Talks on phone: Not on file    Gets together: Not on file    Attends religious service: Not on file    Active member of club or organization: Not on file    Attends meetings of clubs or organizations: Not on file    Relationship status: Not on file  . Intimate partner violence:    Fear of current or ex partner: Not on file    Emotionally abused: Not on file    Physically abused: Not on file    Forced sexual activity:  Not on file  Other Topics Concern  . Not on file  Social History Narrative   Married.   Works as Conservation officer, nature.   Completed some college.   Enjoys Associate Professor guns, black smithing, working on cars.    No past surgical history on file.  Family History  Problem Relation Age of Onset  . Cancer Brother 75       colon, deceased  . Cancer Paternal Grandfather   . Cancer Father     No Known Allergies  Current Outpatient Medications on File Prior to Visit  Medication Sig Dispense Refill  . EPITOL 200 MG tablet TAKE 1 TABLET BY MOUTH THREE TIMES DAILY 30 tablet 0   No current facility-administered medications on file prior to visit.     BP 124/84   Pulse (!) 56   Temp 98.2 F (36.8 C) (Oral)   Ht 5' 10.25" (1.784 m)   Wt 240 lb 8 oz (109.1 kg)   SpO2 95%   BMI 34.26 kg/m    Objective:   Physical Exam  Constitutional: He is  oriented to person, place, and time. He appears well-nourished.  Eyes: EOM are normal.  Neck: Neck supple.  Cardiovascular: Normal rate and regular rhythm.  Respiratory: Effort normal and breath sounds normal.  Neurological: He is alert and oriented to person, place, and time. No cranial nerve deficit.  Skin: Skin is warm and dry.  Psychiatric: He has a normal mood and affect.           Assessment & Plan:

## 2018-02-08 NOTE — Patient Instructions (Signed)
Stop by the lab prior to leaving today. I will notify you of your results once received.   Start exercising. You should be getting 150 minutes of moderate intensity exercise weekly.  It's important to improve your diet by reducing consumption of fast food, fried food, processed snack foods, sugary drinks. Increase consumption of fresh vegetables and fruits, whole grains, water.  Ensure you are drinking 64 ounces of water daily.  It was a pleasure to see you today!

## 2018-02-08 NOTE — Assessment & Plan Note (Signed)
No seizure since 1997, compliant to his carbamazepine for the most part. Carbamezapine level pending today, will send refills once labs return. He continues to refuse neurology evaluation.

## 2018-02-09 LAB — CARBAMAZEPINE LEVEL, TOTAL: Carbamazepine Lvl: 3.4 mg/L — ABNORMAL LOW (ref 4.0–12.0)

## 2018-02-11 ENCOUNTER — Other Ambulatory Visit: Payer: Self-pay | Admitting: Primary Care

## 2018-02-11 DIAGNOSIS — G40909 Epilepsy, unspecified, not intractable, without status epilepticus: Secondary | ICD-10-CM

## 2018-02-11 MED ORDER — CARBAMAZEPINE 200 MG PO TABS: 200.0000 mg | ORAL_TABLET | Freq: Three times a day (TID) | ORAL | 3 refills | Status: DC

## 2019-02-18 ENCOUNTER — Other Ambulatory Visit: Payer: Self-pay | Admitting: Primary Care

## 2019-02-18 DIAGNOSIS — G40909 Epilepsy, unspecified, not intractable, without status epilepticus: Secondary | ICD-10-CM

## 2019-12-17 DIAGNOSIS — G809 Cerebral palsy, unspecified: Secondary | ICD-10-CM | POA: Diagnosis not present

## 2019-12-17 DIAGNOSIS — G40909 Epilepsy, unspecified, not intractable, without status epilepticus: Secondary | ICD-10-CM | POA: Diagnosis not present

## 2019-12-17 DIAGNOSIS — I10 Essential (primary) hypertension: Secondary | ICD-10-CM | POA: Diagnosis not present

## 2019-12-17 DIAGNOSIS — E781 Pure hyperglyceridemia: Secondary | ICD-10-CM | POA: Diagnosis not present

## 2019-12-17 DIAGNOSIS — Z8249 Family history of ischemic heart disease and other diseases of the circulatory system: Secondary | ICD-10-CM | POA: Diagnosis not present

## 2019-12-17 DIAGNOSIS — R079 Chest pain, unspecified: Secondary | ICD-10-CM | POA: Diagnosis not present

## 2020-01-02 ENCOUNTER — Encounter: Payer: Self-pay | Admitting: Primary Care

## 2020-01-02 ENCOUNTER — Other Ambulatory Visit: Payer: Self-pay

## 2020-01-02 ENCOUNTER — Ambulatory Visit (INDEPENDENT_AMBULATORY_CARE_PROVIDER_SITE_OTHER): Payer: Self-pay | Admitting: Primary Care

## 2020-01-02 ENCOUNTER — Ambulatory Visit (INDEPENDENT_AMBULATORY_CARE_PROVIDER_SITE_OTHER)
Admission: RE | Admit: 2020-01-02 | Discharge: 2020-01-02 | Disposition: A | Discharge status: Home / Self Care | Payer: Self-pay | Source: Ambulatory Visit | Attending: Primary Care | Admitting: Primary Care

## 2020-01-02 VITALS — BP 118/70 | HR 59 | Temp 97.8°F | Ht 70.0 in | Wt 239.8 lb

## 2020-01-02 DIAGNOSIS — R053 Chronic cough: Secondary | ICD-10-CM

## 2020-01-02 DIAGNOSIS — G40909 Epilepsy, unspecified, not intractable, without status epilepticus: Secondary | ICD-10-CM

## 2020-01-02 DIAGNOSIS — Z86010 Personal history of colonic polyps: Secondary | ICD-10-CM

## 2020-01-02 DIAGNOSIS — Z1211 Encounter for screening for malignant neoplasm of colon: Secondary | ICD-10-CM

## 2020-01-02 DIAGNOSIS — Z125 Encounter for screening for malignant neoplasm of prostate: Secondary | ICD-10-CM

## 2020-01-02 DIAGNOSIS — E785 Hyperlipidemia, unspecified: Secondary | ICD-10-CM

## 2020-01-02 DIAGNOSIS — Z800 Family history of malignant neoplasm of digestive organs: Secondary | ICD-10-CM

## 2020-01-02 LAB — LDL CHOLESTEROL, DIRECT: Direct LDL: 107 mg/dL

## 2020-01-02 LAB — LIPID PANEL
Cholesterol: 189 mg/dL (ref 0–200)
HDL: 35.1 mg/dL — ABNORMAL LOW (ref >39.00)
NonHDL: 154.01
Total CHOL/HDL Ratio: 5
Triglycerides: 235 mg/dL — ABNORMAL HIGH (ref 0.0–149.0)
VLDL: 47 mg/dL — ABNORMAL HIGH (ref 0.0–40.0)

## 2020-01-02 LAB — PSA: PSA: 0.3 ng/mL (ref 0.10–4.00)

## 2020-01-02 MED ORDER — OMEPRAZOLE 20 MG PO CPDR: 20.0000 mg | DELAYED_RELEASE_CAPSULE | Freq: Every day | ORAL | 0 refills | Status: DC

## 2020-01-02 NOTE — Progress Notes (Signed)
Subjective:    Patient ID: Vincent Padilla, male    DOB: 1970-07-05, 49 y.o.   MRN: [id number]  HPI  This visit occurred during the SARS-CoV-2 public health emergency.  Safety protocols were in place, including screening questions prior to the visit, additional usage of staff PPE, and extensive cleaning of exam room while observing appropriate contact time as indicated for disinfecting solutions.   Vincent Padilla is a 49 year old male with a history of seizure disorder, cerebral palsy, hyperlipidemia who presents today for ED follow up and to discuss chronic cough. He is also needing a repeat colonoscopy.   1) Chest Pain: He presented to [facility] ED in [place] on 12/17/19 with a chief complaint of chest pain.  Sudden onset of right chest pain with radiation through to the scapula, with radiation of pain to left upper extremity, with mild sweating and nausea without fevers, SOB, abdominal pain. He was noted to be slightly anxious, BP was slightly elevated. Work up including ECG, chest xray, labs negative for acute ischemia or pneumonia. Troponin labs negative. He was discharged home later that day with recommendations for Tylenol and PCP follow up.  Since his ED visit he's feeling better but is "paranoid". He continues to notice bilateral chest wall pain, feels muscular. Prior to his symptoms he was lifting a deer with his left upper extremity. He is concerned about cardiac "blockages" given family history in cousins.    2) Chronic Cough: He continues to notice a dry cough since he contracted Covid-19 in August 2020. He is a Doctor, general practice, sprays mites with a vaporized chemical, typically wears a respirator but didn't wear one in late November 2021.  He denies post nasal drip, he does has never smoked, history of asthma, esophageal burning. He notices his cough throughout the day, mostly at night after dinner. He cannot take a deep breath without coughing. His wife recently purchased a box of omeprazole  20 mg, he's taken a few doses of omeprazole but not consistently.   3) Family History of Colon Cancer: Significant family history of colon cancer in brother, father, paternal grandfather. His last colonoscopy was about "8 years ago" unsure where this was done. Was told to come back "probably 5 years after that", but hasn't followed up. He was told that they removed a few polyps.   Review of Systems  Constitutional: Negative for fever.  HENT: Negative for congestion.   Eyes: Negative for visual disturbance.  Respiratory: Positive for cough and shortness of breath.   Cardiovascular: Negative for chest pain.  Neurological: Negative for dizziness and headaches.       Past Medical History:  Diagnosis Date  . Arthritis    Present to neck  . Chicken pox   . Elbow pain 10/05/2017  . Frequent headaches   . Hypertriglyceridemia   . Migraine      Social History   Socioeconomic History  . Marital status: Married    Spouse name: Not on file  . Number of children: Not on file  . Years of education: Not on file  . Highest education level: Not on file  Occupational History  . Not on file  Tobacco Use  . Smoking status: Never Smoker  . Smokeless tobacco: Never Used  Substance and Sexual Activity  . Alcohol use: No    Alcohol/week: 0.0 standard drinks  . Drug use: Not on file  . Sexual activity: Not on file  Other Topics Concern  . Not on file  Social History Narrative   Married.   Works as Conservation officer, nature.   Completed some college.   Enjoys Associate Professor guns, black smithing, working on cars.   Social Determinants of Health   Financial Resource Strain: Not on file  Food Insecurity: Not on file  Transportation Needs: Not on file  Physical Activity: Not on file  Stress: Not on file  Social Connections: Not on file  Intimate Partner Violence: Not on file    No past surgical history on file.  Family History  Problem Relation Age of Onset  . Cancer Brother 84       colon,  deceased  . Cancer Paternal Grandfather   . Cancer Father     No Known Allergies  Current Outpatient Medications on File Prior to Visit  Medication Sig Dispense Refill  . carbamazepine (TEGRETOL) 200 MG tablet TAKE 1 TABLET BY MOUTH THREE TIMES DAILY 270 tablet 1   No current facility-administered medications on file prior to visit.    BP 118/70   Pulse (!) 59   Temp 97.8 F (36.6 C) (Temporal)   Ht 5\' 10"  (1.778 m)   Wt 239 lb 12.8 oz (108.8 kg)   SpO2 96%   BMI 34.41 kg/m    Objective:   Physical Exam Constitutional:      Appearance: He is well-nourished.  Cardiovascular:     Rate and Rhythm: Normal rate and regular rhythm.  Pulmonary:     Effort: Pulmonary effort is normal.     Breath sounds: Normal breath sounds.  Musculoskeletal:     Cervical back: Neck supple.  Skin:    General: Skin is warm and dry.  Psychiatric:        Mood and Affect: Mood and affect and mood normal.            Assessment & Plan:

## 2020-01-02 NOTE — Assessment & Plan Note (Signed)
No recent lipid panel. Repeat lipids pending today.  Discussed the importance of a healthy diet and regular exercise in order for weight loss, and to reduce the risk of any potential medical problems.

## 2020-01-02 NOTE — Assessment & Plan Note (Signed)
Chronic, compliant to Tegretol 200 mg, denies seeing neurology and doesn't know who is filling his carbamazepine...  Discussed that he cannot have two PCP's, he will find out who is filling his medication.

## 2020-01-02 NOTE — Patient Instructions (Signed)
Stop by the lab and xray prior to leaving today. I will notify you of your results once received.   Start omeprazole 20 mg and take this everyday for cough. You must take this daily for at least one month, please update me at that time if no improvement in cough.  You will be contacted regarding your referral to GI for the colonoscopy.  Please let us know if you have not been contacted within two weeks.   It was a pleasure to see you today!

## 2020-01-02 NOTE — Assessment & Plan Note (Addendum)
Significant family history of colon cancer in multiple direct relatives.  Overdue for repeat colonoscopy, referral placed to GI.

## 2020-01-02 NOTE — Assessment & Plan Note (Signed)
Chronic for over one year since Covid-19 infection.  Non smoker, no history of asthma. Could be either allergy or GERD.  Discussed to resume omeprazole 20 mg, refill provided, and to take this everyday for at least one month. He will update via my chart in one month.  Lungs clear on exam. Chest xray pending.

## 2020-02-04 ENCOUNTER — Other Ambulatory Visit: Payer: Self-pay

## 2020-02-04 ENCOUNTER — Telehealth (INDEPENDENT_AMBULATORY_CARE_PROVIDER_SITE_OTHER): Payer: Self-pay | Admitting: Gastroenterology

## 2020-02-04 DIAGNOSIS — Z1211 Encounter for screening for malignant neoplasm of colon: Secondary | ICD-10-CM

## 2020-02-04 DIAGNOSIS — Z800 Family history of malignant neoplasm of digestive organs: Secondary | ICD-10-CM

## 2020-02-04 MED ORDER — GOLYTELY 236 G PO SOLR: 4000.0000 mL | Freq: Once | ORAL | 0 refills | Status: AC

## 2020-02-04 NOTE — Progress Notes (Signed)
Gastroenterology Pre-Procedure Review  Request Date: 02/26/20 Requesting Physician: Dr. Allen Norris  PATIENT REVIEW QUESTIONS: The patient responded to the following health history questions as indicated:    1. Are you having any GI issues? GERD discussed with PCP prescribed omeprazole 2. Do you have a personal history of Polyps? no 3. Do you have a family history of Colon Cancer or Polyps? yes (brother died of colon cancer) 38. Diabetes Mellitus? no 5. Joint replacements in the past 12 months?no 6. Major health problems in the past 3 months?no 7. Any artificial heart valves, MVP, or defibrillator?no    MEDICATIONS & ALLERGIES:    Patient reports the following regarding taking any anticoagulation/antiplatelet therapy:   Plavix, Coumadin, Eliquis, Xarelto, Lovenox, Pradaxa, Brilinta, or Effient? no Aspirin? no  Patient confirms/reports the following medications:  Current Outpatient Medications  Medication Sig Dispense Refill  . carbamazepine (TEGRETOL) 200 MG tablet TAKE 1 TABLET BY MOUTH THREE TIMES DAILY 270 tablet 1  . omeprazole (PRILOSEC) 20 MG capsule Take 1 capsule (20 mg total) by mouth daily. For heartburn/cough. 90 capsule 0  . polyethylene glycol (GOLYTELY) 236 g solution Take 4,000 mLs by mouth once for 1 dose. 4000 mL 0   No current facility-administered medications for this visit.    Patient confirms/reports the following allergies:  No Known Allergies  Orders Placed This Encounter  Procedures  . Procedural/ Surgical Case Request: COLONOSCOPY WITH PROPOFOL    Standing Status:   Standing    Number of Occurrences:   1    Order Specific Question:   Pre-op diagnosis    Answer:   screening colonoscopy, family history of colon cancer (Brother)    Order Specific Question:   CPT Code    Answer:   [phone]    AUTHORIZATION INFORMATION Primary Insurance: 1D#: Group #:  Secondary Insurance: 1D#: Group #:  SCHEDULE INFORMATION: Date: Thursday  02/26/20 Time: Location:[facility]

## 2020-02-05 ENCOUNTER — Ambulatory Visit (INDEPENDENT_AMBULATORY_CARE_PROVIDER_SITE_OTHER): Payer: 59

## 2020-02-05 ENCOUNTER — Ambulatory Visit
Admission: EM | Admit: 2020-02-05 | Discharge: 2020-02-05 | Disposition: A | Discharge status: Home / Self Care | Payer: 59 | Attending: Emergency Medicine | Admitting: Emergency Medicine

## 2020-02-05 ENCOUNTER — Encounter: Payer: Self-pay | Admitting: Emergency Medicine

## 2020-02-05 ENCOUNTER — Other Ambulatory Visit: Payer: Self-pay

## 2020-02-05 ENCOUNTER — Telehealth: Payer: Self-pay

## 2020-02-05 DIAGNOSIS — R079 Chest pain, unspecified: Secondary | ICD-10-CM

## 2020-02-05 DIAGNOSIS — Z1152 Encounter for screening for COVID-19: Secondary | ICD-10-CM | POA: Diagnosis not present

## 2020-02-05 DIAGNOSIS — J069 Acute upper respiratory infection, unspecified: Secondary | ICD-10-CM

## 2020-02-05 DIAGNOSIS — U071 COVID-19: Secondary | ICD-10-CM | POA: Diagnosis not present

## 2020-02-05 DIAGNOSIS — R001 Bradycardia, unspecified: Secondary | ICD-10-CM | POA: Diagnosis not present

## 2020-02-05 DIAGNOSIS — R0789 Other chest pain: Secondary | ICD-10-CM | POA: Diagnosis not present

## 2020-02-05 DIAGNOSIS — R059 Cough, unspecified: Secondary | ICD-10-CM | POA: Diagnosis not present

## 2020-02-05 LAB — SARS CORONAVIRUS 2 (TAT 6-24 HRS): SARS Coronavirus 2: POSITIVE — AB

## 2020-02-05 MED ORDER — PROMETHAZINE-DM 6.25-15 MG/5ML PO SYRP: 5.0000 mL | ORAL_SOLUTION | Freq: Four times a day (QID) | ORAL | 0 refills | Status: DC | PRN

## 2020-02-05 MED ORDER — BENZONATATE 100 MG PO CAPS: 200.0000 mg | ORAL_CAPSULE | Freq: Three times a day (TID) | ORAL | 0 refills | Status: DC

## 2020-02-05 NOTE — ED Provider Notes (Signed)
[facility]    CSN: [id number] Arrival date & time: 02/05/20  1100      History   Chief Complaint Chief Complaint  Patient presents with  . Fever  . Nasal Congestion  . Cough    HPI Vincent Padilla is a 50 y.o. male.   HPI   50 year old male here for evaluation of cough, nasal congestion, chest tightness, and fever that began 2 nights ago.  Patient reports that his T-max was 99.6.  Patient does report that he awoke this morning with a scratchy throat that has resolved.  He has had a longstanding nonproductive cough which has been associated with GERD in the past but in the last 2 days its become more moist but he has not been able to expectorate any phlegm.  Patient has some associated nausea and chills and is also complaining of some central chest pressure that is not reproducible with palpation and is not radiating.  Patient denies runny nose, ear pain, shortness of breath or wheezing, abdominal pain, vomiting, or diarrhea, sweats, body aches, or known COVID exposure.  Patient took 2 home tests 1 was positive for COVID and 1 was negative.  Patient is not vaccinated against COVID or flu.  Past Medical History:  Diagnosis Date  . Arthritis    Present to neck  . Chicken pox   . Elbow pain 10/05/2017  . Frequent headaches   . Hypertriglyceridemia   . Migraine     Patient Active Problem List   Diagnosis Date Noted  . Chronic cough 01/02/2020  . Hyperlipidemia 06/08/2016  . Preventative health care 09/04/2014  . Seizure disorder ([place]) 05/22/2014  . History of colonic polyps 05/22/2014  . Obesity (BMI 30.0-34.9) 05/22/2014    History reviewed. No pertinent surgical history.     Home Medications    Prior to Admission medications   Medication Sig Start Date End Date Taking? Authorizing Provider  benzonatate (TESSALON) 100 MG capsule Take 2 capsules (200 mg total) by mouth every 8 (eight) hours. 02/05/20  Yes Margarette Canada, NP  promethazine-dextromethorphan  (PROMETHAZINE-DM) 6.25-15 MG/5ML syrup Take 5 mLs by mouth 4 (four) times daily as needed. 02/05/20  Yes Margarette Canada, NP  carbamazepine (TEGRETOL) 200 MG tablet TAKE 1 TABLET BY MOUTH THREE TIMES DAILY 02/19/19   [name], NP  omeprazole (PRILOSEC) 20 MG capsule Take 1 capsule (20 mg total) by mouth daily. For heartburn/cough. 01/02/20   [name], NP    Family History Family History  Problem Relation Age of Onset  . Cancer Brother 70       colon, deceased  . Cancer Paternal Grandfather   . Cancer Father     Social History Social History   Tobacco Use  . Smoking status: Never Smoker  . Smokeless tobacco: Never Used  Substance Use Topics  . Alcohol use: No    Alcohol/week: 0.0 standard drinks     Allergies   Patient has no known allergies.   Review of Systems Review of Systems  Constitutional: Positive for chills. Negative for activity change, appetite change, diaphoresis and fever.  HENT: Positive for congestion and sore throat. Negative for ear pain and rhinorrhea.   Respiratory: Positive for cough and chest tightness. Negative for shortness of breath and wheezing.   Cardiovascular: Negative for palpitations and leg swelling.  Gastrointestinal: Positive for nausea. Negative for abdominal pain, diarrhea and vomiting.  Musculoskeletal: Negative for arthralgias and myalgias.  Skin: Negative for rash.  Hematological: Negative.  Psychiatric/Behavioral: Negative.      Physical Exam Triage Vital Signs ED Triage Vitals  Enc Vitals Group     BP      Pulse      Resp      Temp      Temp src      SpO2      Weight      Height      Head Circumference      Peak Flow      Pain Score      Pain Loc      Pain Edu?      Excl. in [place]?    No data found.  Updated Vital Signs BP 125/88 (BP Location: Left Arm)   Pulse 68   Temp 98.3 F (36.8 C)   Resp 18   SpO2 98%   Visual Acuity Right Eye Distance:   Left Eye Distance:   Bilateral Distance:     Right Eye Near:   Left Eye Near:    Bilateral Near:     Physical Exam Vitals and nursing note reviewed.  Constitutional:      General: He is not in acute distress.    Appearance: Normal appearance. He is obese. He is not toxic-appearing.  HENT:     Head: Normocephalic and atraumatic.     Right Ear: Tympanic membrane, ear canal and external ear normal.     Left Ear: Tympanic membrane, ear canal and external ear normal.     Nose: Congestion and rhinorrhea present.     Comments: Nasal mucosa is erythematous and edematous with clear nasal discharge.    Mouth/Throat:     Mouth: Mucous membranes are moist.     Pharynx: Oropharynx is clear. No oropharyngeal exudate or posterior oropharyngeal erythema.  Cardiovascular:     Rate and Rhythm: Normal rate and regular rhythm.     Pulses: Normal pulses.     Heart sounds: Normal heart sounds. No murmur heard. No gallop.   Pulmonary:     Effort: Pulmonary effort is normal.     Breath sounds: Normal breath sounds. No wheezing, rhonchi or rales.  Musculoskeletal:     Cervical back: Normal range of motion and neck supple.  Lymphadenopathy:     Cervical: No cervical adenopathy.  Skin:    General: Skin is warm and dry.     Capillary Refill: Capillary refill takes less than 2 seconds.     Findings: No erythema or rash.  Neurological:     General: No focal deficit present.     Mental Status: He is alert and oriented to person, place, and time.  Psychiatric:        Mood and Affect: Mood normal.        Behavior: Behavior normal.        Thought Content: Thought content normal.        Judgment: Judgment normal.      UC Treatments / Results  Labs (all labs ordered are listed, but only abnormal results are displayed) Labs Reviewed  SARS CORONAVIRUS 2 (TAT 6-24 HRS)    EKG   Radiology DG Chest 2 View  Result Date: 02/05/2020 CLINICAL DATA:  Cough, chest tightness EXAM: CHEST - 2 VIEW COMPARISON:  01/02/2020 FINDINGS: The heart size  and mediastinal contours are within normal limits. No focal airspace consolidation, pleural effusion, or pneumothorax. The visualized skeletal structures are unremarkable. IMPRESSION: No active cardiopulmonary disease. Electronically Signed   By: Davina Poke D.O.  On: 02/05/2020 12:29    Procedures Procedures (including critical care time)  Medications Ordered in UC Medications - No data to display  Initial Impression / Assessment and Plan / UC Course  I have reviewed the triage vital signs and the nursing notes.  Pertinent labs & imaging results that were available during my care of the patient were reviewed by me and considered in my medical decision making (see chart for details).   Patient is here for confirmation testing after having 2 at home COVID test 1 of which was positive and the other was negative.  Patient's had mild symptoms for the past 2 nights consisting of a productive cough but not to the point of asked back duration, chills, scratchy throat that is resolved, and fever.  Patient's T-max was 99.6.  Patient denies other respiratory complaints.  Patient does complain of some central chest pressure which is not reproducible to palpation is not reproducible with deep breathing and does not radiate.  Patient contacted his PCP who directed him to come to the ER for evaluation.  Will swab patient for COVID, obtain an EKG, and chest x-ray.  EKG shows sinus bradycardia with a rate of 57, normal axis, PR 132, QRS 84.  Largely unchanged when compared to EKG report from 12/17/2019 done at [facility] ER.  No tracing available for comparison.  Chest x-ray read as normal by radiology.  Will discharge patient home to isolate pending the results of his COVID swab.  If he is positive he will have to quarantine for 5 days from his symptoms started.  Will give patient Tessalon Perles and Promethazine DM for cough.  Final Clinical Impressions(s) / UC Diagnoses   Final diagnoses:  Encounter for  screening for COVID-19  Viral URI with cough     Discharge Instructions     Isolate at home until the results of your COVID test are back.  If you are positive then you will need to quarantine for 5 days from when your symptoms started.  After the 5 days you can break quarantine if your symptoms have improved and you have not run a fever for 24 hours without taking Tylenol and ibuprofen.  Use over-the-counter Tylenol and ibuprofen as needed for fever and body aches.  Use the Tessalon Perles during the day as needed for cough and the Promethazine DM cough syrup at bedtime as it would make you drowsy.  If you develop shortness of breath-especially at rest, you are unable to speak in full sentences, or is a late sign your lips are turning blue you need to go to the ER for evaluation.    ED Prescriptions    Medication Sig Dispense Auth. Provider   benzonatate (TESSALON) 100 MG capsule Take 2 capsules (200 mg total) by mouth every 8 (eight) hours. 21 capsule Margarette Canada, NP   promethazine-dextromethorphan (PROMETHAZINE-DM) 6.25-15 MG/5ML syrup Take 5 mLs by mouth 4 (four) times daily as needed. 118 mL Margarette Canada, NP     PDMP not reviewed this encounter.   Margarette Canada, NP 02/05/20 1244

## 2020-02-05 NOTE — ED Triage Notes (Signed)
Pt states that he has a cough, nasal congestion, tightness in his chest, and fever. Pt states that his sx started two nights ago. Pt states that he took two at home test one came out positive and one negative. Pt states he took the covid at home test last night

## 2020-02-05 NOTE — Discharge Instructions (Addendum)
Isolate at home until the results of your COVID test are back.  If you are positive then you will need to quarantine for 5 days from when your symptoms started.  After the 5 days you can break quarantine if your symptoms have improved and you have not run a fever for 24 hours without taking Tylenol and ibuprofen.  Use over-the-counter Tylenol and ibuprofen as needed for fever and body aches.  Use the Tessalon Perles during the day as needed for cough and the Promethazine DM cough syrup at bedtime as it would make you drowsy.  If you develop shortness of breath-especially at rest, you are unable to speak in full sentences, or is a late sign your lips are turning blue you need to go to the ER for evaluation.

## 2020-02-05 NOTE — Telephone Encounter (Signed)
Disagree with triage report, patient does not need to be seen in ED just because he has Covid with Covid symptoms. I do see that he has an appointment scheduled with our practice for tomorrow which is good.

## 2020-02-05 NOTE — Telephone Encounter (Signed)
[facility] - Client TELEPHONE ADVICE RECORD [facility] Patient Name: Vincent Padilla Gender: Male DOB: 09-29-1970 Age: 50 Y 2 M 3 D Return Phone Number: [phone] (Primary) Address[place] [address] Client [facility] - Client Client Site [place] - Day Physician Alma Friendly - NP Contact Type Call Who Is Calling Patient / Member / Family / Caregiver Call Type Triage / Clinical Relationship To Patient Self Return Phone Number [phone] (Primary) Chief Complaint Nausea Reason for Call Symptomatic / Request for [place] states he tested + for covid yesterday and is having a fever, congestion and nausea. Translation No Nurse Assessment Nurse: Ysidro Evert, RN, Levada Dy Date/Time (Eastern Time): 02/05/2020 9:25:12 AM Confirm and document reason for call. If symptomatic, describe symptoms. ---Caller states he tested positive for covid yesterday and he has congestion, fever and nausea. Does the patient have any new or worsening symptoms? ---Yes Will a triage be completed? ---Yes Related visit to physician within the last 2 weeks? ---No Does the PT have any chronic conditions? (i.e. diabetes, asthma, this includes High risk factors for pregnancy, etc.) ---No Is this a behavioral health or substance abuse call? ---No Guidelines Guideline Title Affirmed Question Affirmed Notes Nurse Date/Time ([place] Time) COVID-19 - Diagnosed or Suspected SEVERE or constant chest pain or pressure (Exception: mild central chest pain, present only when coughing) Ysidro Evert, RN, Levada Dy 02/05/2020 9:26:36 AM Disp. Time Eil[place] Ghazi Time) Disposition Final User 02/05/2020 9:29:31 AM Go to ED Now Yes Ysidro Evert, RN, Marin Shutter Disagree/Comply Comply Caller Understands Yes PreDisposition Did not know what to do PLEASE NOTE: All timestamps contained within this report are  represented as [place] Standard Time. CONFIDENTIALTY NOTICE: This fax transmission is intended only for the addressee. It contains information that is legally privileged, confidential or otherwise protected from use or disclosure. If you are not the intended recipient, you are strictly prohibited from reviewing, disclosing, copying using or disseminating any of this information or taking any action in reliance on or regarding this information. If you have received this fax in error, please notify us immediately by telephone so that we can arrange for its return to [place]. Phone: [phone], Toll-Free: [phone], Fax: [phone] Page: 2 of 2 Call Id: [id number] Care Advice Given Per Guideline GO TO ED NOW: * You need to be seen in the Emergency Department. YOU SHOULD TELL HEALTHCARE PERSONNEL THAT YOU MIGHT HAVE COVID-19: * Tell the first healthcare worker you meet that you may have COVID-19. WEAR A MASK - COVER YOUR MOUTH AND NOSE: * Wear a mask. ANOTHER ADULT SHOULD DRIVE: * It is better and safer if another adult drives instead of you. CALL EMS 911 IF: * Severe difficulty breathing occurs * Lips or face turns blue * Confusion occurs. CARE ADVICE given per COVID-19 - DIAGNOSED OR SUSPECTED (Adult) guideline. Referrals GO TO FACILITY UNDECIDED

## 2020-02-06 ENCOUNTER — Encounter: Payer: Self-pay | Admitting: Family Medicine

## 2020-02-06 ENCOUNTER — Telehealth: Payer: 59 | Admitting: Family Medicine

## 2020-02-06 ENCOUNTER — Telehealth (INDEPENDENT_AMBULATORY_CARE_PROVIDER_SITE_OTHER): Payer: 59 | Admitting: Family Medicine

## 2020-02-06 VITALS — HR 87 | Ht 70.0 in

## 2020-02-06 DIAGNOSIS — E669 Obesity, unspecified: Secondary | ICD-10-CM | POA: Diagnosis not present

## 2020-02-06 DIAGNOSIS — K219 Gastro-esophageal reflux disease without esophagitis: Secondary | ICD-10-CM | POA: Diagnosis not present

## 2020-02-06 DIAGNOSIS — U071 COVID-19: Secondary | ICD-10-CM | POA: Diagnosis not present

## 2020-02-06 MED ORDER — ONDANSETRON HCL 4 MG PO TABS: 4.0000 mg | ORAL_TABLET | Freq: Three times a day (TID) | ORAL | 0 refills | Status: DC | PRN

## 2020-02-06 NOTE — Assessment & Plan Note (Signed)
Poor control. Increase omeprazole to 40 mg daily. Trigger avoidance.  Follow up with PCP if not improving.

## 2020-02-06 NOTE — Patient Instructions (Addendum)
Increase omeprazole to 2 tabs daily. Can use zofran for nausea. Start mucinex  DM at bedtime. Can use tylenol for sore throat or body pain.  Follow oxygen saturation.. if < 90% go to ER.     Person Under Monitoring Name: Vincent Padilla  Location: [address] [place] [address]   Infection Prevention Recommendations for Individuals Confirmed to have, or Being Evaluated for, 2019 Novel Coronavirus (COVID-19) Infection Who Receive Care at Home  Individuals who are confirmed to have, or are being evaluated for, COVID-19 should follow the prevention steps below until a healthcare provider or local or state health department says they can return to normal activities.  Stay home except to get medical care You should restrict activities outside your home, except for getting medical care. Do not go to work, school, or public areas, and do not use public transportation or taxis.  Call ahead before visiting your doctor Before your medical appointment, call the healthcare provider and tell them that you have, or are being evaluated for, COVID-19 infection. This will help the healthcare provider's office take steps to keep other people from getting infected. Ask your healthcare provider to call the local or state health department.  Monitor your symptoms Seek prompt medical attention if your illness is worsening (e.g., difficulty breathing). Before going to your medical appointment, call the healthcare provider and tell them that you have, or are being evaluated for, COVID-19 infection. Ask your healthcare provider to call the local or state health department.  Wear a facemask You should wear a facemask that covers your nose and mouth when you are in the same room with other people and when you visit a healthcare provider. People who live with or visit you should also wear a facemask while they are in the same room with you.  Separate yourself from other people in your home As much  as possible, you should stay in a different room from other people in your home. Also, you should use a separate bathroom, if available.  Avoid sharing household items You should not share dishes, drinking glasses, cups, eating utensils, towels, bedding, or other items with other people in your home. After using these items, you should wash them thoroughly with soap and water.  Cover your coughs and sneezes Cover your mouth and nose with a tissue when you cough or sneeze, or you can cough or sneeze into your sleeve. Throw used tissues in a lined trash can, and immediately wash your hands with soap and water for at least 20 seconds or use an alcohol-based hand rub.  Wash your [facility] your hands often and thoroughly with soap and water for at least 20 seconds. You can use an alcohol-based hand sanitizer if soap and water are not available and if your hands are not visibly dirty. Avoid touching your eyes, nose, and mouth with unwashed hands.   Prevention Steps for Caregivers and Household Members of Individuals Confirmed to have, or Being Evaluated for, COVID-19 Infection Being Cared for in the Home  If you live with, or provide care at home for, a person confirmed to have, or being evaluated for, COVID-19 infection please follow these guidelines to prevent infection:  Follow healthcare provider's instructions Make sure that you understand and can help the patient follow any healthcare provider instructions for all care.  Provide for the patient's basic needs You should help the patient with basic needs in the home and provide support for getting groceries, prescriptions, and other  personal needs.  Monitor the patient's symptoms If they are getting sicker, call his or her medical provider and tell them that the patient has, or is being evaluated for, COVID-19 infection. This will help the healthcare provider's office take steps to keep other people from getting infected. Ask the  healthcare provider to call the local or state health department.  Limit the number of people who have contact with the patient  If possible, have only one caregiver for the patient.  Other household members should stay in another home or place of residence. If this is not possible, they should stay  in another room, or be separated from the patient as much as possible. Use a separate bathroom, if available.  Restrict visitors who do not have an essential need to be in the home.  Keep older adults, very young children, and other sick people away from the patient Keep older adults, very young children, and those who have compromised immune systems or chronic health conditions away from the patient. This includes people with chronic heart, lung, or kidney conditions, diabetes, and cancer.  Ensure good ventilation Make sure that shared spaces in the home have good air flow, such as from an air conditioner or an opened window, weather permitting.  Wash your hands often  Wash your hands often and thoroughly with soap and water for at least 20 seconds. You can use an alcohol based hand sanitizer if soap and water are not available and if your hands are not visibly dirty.  Avoid touching your eyes, nose, and mouth with unwashed hands.  Use disposable paper towels to dry your hands. If not available, use dedicated cloth towels and replace them when they become wet.  Wear a facemask and gloves  Wear a disposable facemask at all times in the room and gloves when you touch or have contact with the patient's blood, body fluids, and/or secretions or excretions, such as sweat, saliva, sputum, nasal mucus, vomit, urine, or feces.  Ensure the mask fits over your nose and mouth tightly, and do not touch it during use.  Throw out disposable facemasks and gloves after using them. Do not reuse.  Wash your hands immediately after removing your facemask and gloves.  If your personal clothing becomes  contaminated, carefully remove clothing and launder. Wash your hands after handling contaminated clothing.  Place all used disposable facemasks, gloves, and other waste in a lined container before disposing them with other household waste.  Remove gloves and wash your hands immediately after handling these items.  Do not share dishes, glasses, or other household items with the patient  Avoid sharing household items. You should not share dishes, drinking glasses, cups, eating utensils, towels, bedding, or other items with a patient who is confirmed to have, or being evaluated for, COVID-19 infection.  After the person uses these items, you should wash them thoroughly with soap and water.  Wash laundry thoroughly  Immediately remove and wash clothes or bedding that have blood, body fluids, and/or secretions or excretions, such as sweat, saliva, sputum, nasal mucus, vomit, urine, or feces, on them.  Wear gloves when handling laundry from the patient.  Read and follow directions on labels of laundry or clothing items and detergent. In general, wash and dry with the warmest temperatures recommended on the label.  Clean all areas the individual has used often  Clean all touchable surfaces, such as counters, tabletops, doorknobs, bathroom fixtures, toilets, phones, keyboards, tablets, and bedside tables, every day. Also, clean  any surfaces that may have blood, body fluids, and/or secretions or excretions on them.  Wear gloves when cleaning surfaces the patient has come in contact with.  Use a diluted bleach solution (e.g., dilute bleach with 1 part bleach and 10 parts water) or a household disinfectant with a label that says EPA-registered for coronaviruses. To make a bleach solution at home, add 1 tablespoon of bleach to 1 quart (4 cups) of water. For a larger supply, add  cup of bleach to 1 gallon (16 cups) of water.  Read labels of cleaning products and follow recommendations provided on  product labels. Labels contain instructions for safe and effective use of the cleaning product including precautions you should take when applying the product, such as wearing gloves or eye protection and making sure you have good ventilation during use of the product.  Remove gloves and wash hands immediately after cleaning.  Monitor yourself for signs and symptoms of illness Caregivers and household members are considered close contacts, should monitor their health, and will be asked to limit movement outside of the home to the extent possible. Follow the monitoring steps for close contacts listed on the symptom monitoring form.   ? If you have additional questions, contact your local health department or call the epidemiologist on call at [phone] (available 24/7). ? This guidance is subject to change. For the most up-to-date guidance from [facility], please refer to their website: [email or web address].pl

## 2020-02-06 NOTE — Progress Notes (Signed)
VIRTUAL VISIT Due to national recommendations of social distancing due to [place] 19, a virtual visit is felt to be most appropriate for this patient at this time.   I connected with the patient on 02/06/20 at 10:40 AM EST by virtual telehealth platform and verified that I am speaking with the correct person using two identifiers.   I discussed the limitations, risks, security and privacy concerns of performing an evaluation and management service by  virtual telehealth platform and the availability of in person appointments. I also discussed with the patient that there may be a patient responsible charge related to this service. The patient expressed understanding and agreed to proceed.  Patient location: Home Provider Location: [facility] Participants: Eliezer Lofts and Suszanne Conners   Chief Complaint  Patient presents with  . Covid Positive    PCR test came by positive this morning  . Fever  . Nasal Congestion  . Cough    History of Present Illness: 50 year old male patient pf Tawni Millers with history of obesity, seizure disorder and chronic cough presents with COVID infection. Was seen in ED 02/05/20 tested for COVID: reviewed note in detail and exam. Onset of symptoms 02/03/20.Marland Kitchen low grade tempo 99.6 F  On 1/19 Scratchy throat, chronic dry cough now more moist, nausea, chills.  Central chest pressure noted.  CXR reviewed: no active cardiopulmonary disease  EKG: reviewed: shows sinus brady rate 57, unchanged from previous 12/17/2019 at [facility] ER. Nonsmoker  No past history of chronic respiratory issues except chronic cough since COVID infection in 2020 Reviewed PCP OV on 01/02/2020 Though in part cough due to GERD.   Currently he report nausea, congestion, cough, moist but not productive. Cough  keeping him up at night.  Has had chest pressure since 12/1.Marland Kitchen dx with GERD... worse with eating, no change with exertion. Using omeprazole 20 mg daily x 4 weeks.  No SOB, no  wheeze  Using tessalon perles and does not want to use phenergan DM.   COVID 19 screen COVID testing:2 home tests: 1 positive and 1 negative, 02/05/20 positive PCR test COVID vaccine:none COVID exposure: No recent travel or known exposure to [place]  The importance of social distancing was discussed today.    Review of Systems  Constitutional: Positive for malaise/fatigue. Negative for chills.  HENT: Positive for congestion and sore throat. Negative for ear pain.   Eyes: Negative for pain and redness.  Respiratory: Positive for cough. Negative for shortness of breath.   Cardiovascular: Negative for chest pain, palpitations and leg swelling.  Gastrointestinal: Negative for abdominal pain, blood in stool, constipation, diarrhea, nausea and vomiting.  Genitourinary: Negative for dysuria.  Musculoskeletal: Negative for falls and myalgias.  Skin: Negative for rash.  Neurological: Negative for dizziness.  Psychiatric/Behavioral: Negative for depression. The patient is not nervous/anxious.       Past Medical History:  Diagnosis Date  . Arthritis    Present to neck  . Chicken pox   . Elbow pain 10/05/2017  . Frequent headaches   . Hypertriglyceridemia   . Migraine     reports that he has never smoked. He has never used smokeless tobacco. He reports that he does not drink alcohol.   Current Outpatient Medications:  .  benzonatate (TESSALON) 100 MG capsule, Take 2 capsules (200 mg total) by mouth every 8 (eight) hours., Disp: 21 capsule, Rfl: 0 .  carbamazepine (TEGRETOL) 200 MG tablet, TAKE 1 TABLET BY MOUTH THREE TIMES DAILY, Disp: 270 tablet, Rfl:  1 .  omeprazole (PRILOSEC) 20 MG capsule, Take 1 capsule (20 mg total) by mouth daily. For heartburn/cough., Disp: 90 capsule, Rfl: 0 .  promethazine-dextromethorphan (PROMETHAZINE-DM) 6.25-15 MG/5ML syrup, Take 5 mLs by mouth 4 (four) times daily as needed. (Patient not taking: Reported on 02/06/2020), Disp: 118 mL, Rfl: 0    Observations/Objective: Pulse 87, height 5\' 10"  (1.778 m), SpO2 97 %.  Physical Exam  Physical Exam Constitutional:      General: The patient is not in acute distress. Pulmonary:     Effort: Pulmonary effort is normal. No respiratory distress.  Neurological:     Mental Status: The patient is alert and oriented to person, place, and time.  Psychiatric:        Mood and Affect: Mood normal.        Behavior: Behavior normal.   Assessment and Plan Problem List Items Addressed This Visit    COVID-19 virus infection - Primary     COVID19  infection . No clear sign of bacterial infection at this time.  No SOB.  No red flags/need for ER visit or in-person exam at respiratory clinic at this time.Marland Kitchen    Pt mild moderate risk for COVID complications given  Obesity. He requests referral to be considered for monoclonal antibodies for despite only one risk factor.. wife is a Marine scientist and thinks he should qualify.  If SOB begins symptoms worsening.. have low threshold for in-person exam, if severe shortness of breath ER visit recommended.  Can monitor Oxygen saturation at home with home monitor if able to obtain.  Go to ER if O2 sat < 90% on room air.  Reviewed home care and provided information through [place].  Recommended quarantine until test returns. If returns positive 5 days isolation recommended. Return to work day 6 and wear mask for 4 more days to complete 10 days. Provided info about prevention of spread of COVID 19.       Gastroesophageal reflux disease    Poor control. Increase omeprazole to 40 mg daily. Trigger avoidance.  Follow up with PCP if not improving.      Relevant Medications   ondansetron (ZOFRAN) 4 MG tablet   Obesity (BMI 30.0-34.9)      Orders Placed This Encounter  Procedures  . Ambulatory Referral for Covid Treatment    Referral Priority:   Routine    Referral Type:   Auth/Cert    Referral Reason:   Specialty Services Required    Number of Visits  Requested:   1    Meds ordered this encounter  Medications  . ondansetron (ZOFRAN) 4 MG tablet    Sig: Take 1 tablet (4 mg total) by mouth every 8 (eight) hours as needed for nausea or vomiting.    Dispense:  20 tablet    Refill:  0      I discussed the assessment and treatment plan with the patient. The patient was provided an opportunity to ask questions and all were answered. The patient agreed with the plan and demonstrated an understanding of the instructions.   The patient was advised to call back or seek an in-person evaluation if the symptoms worsen or if the condition fails to improve as anticipated.     Eliezer Lofts, MD

## 2020-02-06 NOTE — Assessment & Plan Note (Signed)
COVID19  infection . No clear sign of bacterial infection at this time.  No SOB.  No red flags/need for ER visit or in-person exam at respiratory clinic at this time.Marland Kitchen    Pt mild moderate risk for COVID complications given  Obesity. He requests referral to be considered for monoclonal antibodies for despite only one risk factor.. wife is a Marine scientist and thinks he should qualify.  If SOB begins symptoms worsening.. have low threshold for in-person exam, if severe shortness of breath ER visit recommended.  Can monitor Oxygen saturation at home with home monitor if able to obtain.  Go to ER if O2 sat < 90% on room air.  Reviewed home care and provided information through [place].  Recommended quarantine until test returns. If returns positive 5 days isolation recommended. Return to work day 6 and wear mask for 4 more days to complete 10 days. Provided info about prevention of spread of COVID 19.

## 2020-02-07 DIAGNOSIS — Z8669 Personal history of other diseases of the nervous system and sense organs: Secondary | ICD-10-CM | POA: Diagnosis not present

## 2020-02-07 DIAGNOSIS — G809 Cerebral palsy, unspecified: Secondary | ICD-10-CM | POA: Diagnosis not present

## 2020-02-07 DIAGNOSIS — R079 Chest pain, unspecified: Secondary | ICD-10-CM | POA: Diagnosis not present

## 2020-02-07 DIAGNOSIS — Z79899 Other long term (current) drug therapy: Secondary | ICD-10-CM | POA: Diagnosis not present

## 2020-02-07 DIAGNOSIS — R0789 Other chest pain: Secondary | ICD-10-CM | POA: Diagnosis not present

## 2020-02-07 DIAGNOSIS — R638 Other symptoms and signs concerning food and fluid intake: Secondary | ICD-10-CM | POA: Diagnosis not present

## 2020-02-07 DIAGNOSIS — U071 COVID-19: Secondary | ICD-10-CM | POA: Diagnosis not present

## 2020-02-09 ENCOUNTER — Telehealth: Payer: Self-pay

## 2020-02-09 ENCOUNTER — Telehealth: Payer: Self-pay | Admitting: Gastroenterology

## 2020-02-09 NOTE — Telephone Encounter (Signed)
Patient is calling in wanting to know if Dr.Bedsole can place a referral for a cardiologist, and also wanting to know if there is an update about the monoclonal antibodies test.

## 2020-02-09 NOTE — Telephone Encounter (Signed)
Patient having chest pain and wants advice if this could be GI related.  He also has a Colonoscopy scheduled 2/10 /22.  He has had his CC pre visit screening appointment.  Do we need to schedule an office visit? Can he keep his procedure scheduled as is?

## 2020-02-09 NOTE — Telephone Encounter (Signed)
It looks like everything has been done.  I recommend he reconsider Covid-19 vaccinations once he recovers.

## 2020-02-09 NOTE — Telephone Encounter (Signed)
I spoke with patient to get an update on his condition:  Currently:   Patient is having ongoing constant ache chest pain to the center of his chest (which he says has been going on since Dec 1).  Was seen in the [facility] ER on 02/07/20.  He says this may be a little worse today.   Denies any SOB, N/V, or Diarrhea Temp today 98.3 C/O:  Chills, body aches intermittent, fatigue, weakness (Taking Tylenol prn)  Cough is frequent and dry and hacking   What he is requesting:  1.  To see a cardiologist.  Since his call earlier today he has received a call with an appt to see [facility] cardiology on 02/28/20.   2.  To see a GI:  He currently has a call in to his GI doctor to see if his current appt can be moved up sooner.  He will wait on a response from them.  3.  To see if we can expedite his referral to the [facility] infusion center - I explained that the referral was made a few days ago and that we do not have any control over who they are able to take.  I went on to explain that due to high demand, they process patients through an algorithm to see based on severity who is at the highest risk/need.  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    I will forward to PCP to see if there is anything further she would like or we can offer at this point.

## 2020-02-10 ENCOUNTER — Encounter: Payer: Self-pay | Admitting: Gastroenterology

## 2020-02-10 NOTE — Telephone Encounter (Signed)
Contacted pt and scheduled an office appt to discuss his non cardiac chest pain. Advised pt we will keep his colonoscopy as scheduled and will add an EGD if Dr. Allen Norris needs to evaluate the esophagus and stomach.

## 2020-02-12 ENCOUNTER — Telehealth (INDEPENDENT_AMBULATORY_CARE_PROVIDER_SITE_OTHER): Payer: 59 | Admitting: Gastroenterology

## 2020-02-12 ENCOUNTER — Encounter: Payer: Self-pay | Admitting: Gastroenterology

## 2020-02-12 DIAGNOSIS — K219 Gastro-esophageal reflux disease without esophagitis: Secondary | ICD-10-CM

## 2020-02-12 NOTE — Progress Notes (Signed)
Lucilla Lame, MD [address]  [place]  [place], [place] [address]  Main: [phone]  Fax: [phone]    Gastroenterology Virtual/Video Visit  Referring Provider:     [name], NP Primary Care Physician:  [name], NP Primary Gastroenterologist:  Dr.Yecheskel Kurek Allen Norris Reason for Consultation:     Chest pain        HPI:    Virtual Visit via Video Note Location of the patient: Home Location of provider: Office  Participating persons: The patient myself and Ginger Feldpausch.  I connected with Vincent Padilla on 02/12/20 at  2:00 PM EST by a video enabled telemedicine application and verified that I am speaking with the correct person using two identifiers.   I discussed the limitations of evaluation and management by telemedicine and the availability of in person appointments. The patient expressed understanding and agreed to proceed.  Verbal consent to proceed obtained.  History of Present Illness: Vincent Padilla is a 50 y.o. male referred by Dr. Carlis Abbott, Leticia Penna, NP  for consultation & management of Chest pain.  The patient reports that he is been having chest pain.  The patient did not come into the office today because he was recently exposed and contracted Covid.  The patient reports that he is not vaccinated.  He has been having a cough despite telling me that he has had the cough before having COVID.  He was set up for a colonoscopy but thought that he would need a upper endoscopy because of his midsternal chest pain.  He states that he has been evaluated for other causes and was recommended that it may be from GI but not cardiac or pulmonary.  The patient reports that he lost weight since contracting Covid but has not been losing weight on a regular basis. He reports that he takes omeprazole and says that he takes 40 mg although his medication list is only 20 mg.  Past Medical History:  Diagnosis Date  . Arthritis    Present to neck  . Chicken  pox   . COVID-19   . Elbow pain 10/05/2017  . Frequent headaches   . Hypertriglyceridemia   . Migraine     No past surgical history on file.  Prior to Admission medications   Medication Sig Start Date End Date Taking? Authorizing Provider  benzonatate (TESSALON) 100 MG capsule Take 2 capsules (200 mg total) by mouth every 8 (eight) hours. 02/05/20   Margarette Canada, NP  carbamazepine (TEGRETOL) 200 MG tablet TAKE 1 TABLET BY MOUTH THREE TIMES DAILY 02/19/19   [name], NP  omeprazole (PRILOSEC) 20 MG capsule Take 1 capsule (20 mg total) by mouth daily. For heartburn/cough. 01/02/20   [name], NP  ondansetron (ZOFRAN) 4 MG tablet Take 1 tablet (4 mg total) by mouth every 8 (eight) hours as needed for nausea or vomiting. 02/06/20   Jinny Sanders, MD    Family History  Problem Relation Age of Onset  . Cancer Brother 77       colon, deceased  . Cancer Paternal Grandfather   . Cancer Father      Social History   Tobacco Use  . Smoking status: Never Smoker  . Smokeless tobacco: Never Used  Substance Use Topics  . Alcohol use: No    Alcohol/week: 0.0 standard drinks    Allergies as of 02/12/2020  . (No Known Allergies)    Review of Systems:    All  systems reviewed and negative except where noted in HPI.   Observations/Objective:  Labs: CBC    Component Value Date/Time   WBC 5.9 09/04/2014 0826   RBC 5.03 09/04/2014 0826   HGB 15.4 09/04/2014 0826   HCT 44.4 09/04/2014 0826   PLT 218.0 09/04/2014 0826   MCV 88.3 09/04/2014 0826   MCHC 34.6 09/04/2014 0826   RDW 12.7 09/04/2014 0826   CMP     Component Value Date/Time   NA 140 02/08/2018 0749   K 3.7 02/08/2018 0749   CL 104 02/08/2018 0749   CO2 30 02/08/2018 0749   GLUCOSE 113 (H) 02/08/2018 0749   BUN 10 02/08/2018 0749   CREATININE 0.91 02/08/2018 0749   CALCIUM 9.0 02/08/2018 0749   PROT 6.8 02/08/2018 0749   ALBUMIN 4.3 02/08/2018 0749   AST 16 02/08/2018 0749   ALT 22 02/08/2018  0749   ALKPHOS 53 02/08/2018 0749   BILITOT 0.5 02/08/2018 0749    Imaging Studies: DG Chest 2 View  Result Date: 02/05/2020 CLINICAL DATA:  Cough, chest tightness EXAM: CHEST - 2 VIEW COMPARISON:  01/02/2020 FINDINGS: The heart size and mediastinal contours are within normal limits. No focal airspace consolidation, pleural effusion, or pneumothorax. The visualized skeletal structures are unremarkable. IMPRESSION: No active cardiopulmonary disease. Electronically Signed   By: Davina Poke D.O.   On: 02/05/2020 12:29    Assessment and Plan:   ZAQUAN DUFFNER is a 50 y.o. y/o male has been referred for atypical chest pain. The patient will be set up for a upper endoscopy.  The patient states that his Prilosec is not taking care of the chest pain. The patient will be set up for the EGD at the same time as his upcoming colonoscopy.  The patient has been explained the plan and agrees with it.  Follow Up Instructions:  I discussed the assessment and treatment plan with the patient. The patient was provided an opportunity to ask questions and all were answered. The patient agreed with the plan and demonstrated an understanding of the instructions.   The patient was advised to call back or seek an in-person evaluation if the symptoms worsen or if the condition fails to improve as anticipated.  I provided 25 minutes of non-face-to-face time during this encounter.   Lucilla Lame, MD  Speech recognition software was used to dictate the above note.

## 2020-02-13 ENCOUNTER — Encounter: Payer: Self-pay | Admitting: Gastroenterology

## 2020-02-16 ENCOUNTER — Other Ambulatory Visit: Payer: Self-pay

## 2020-02-16 ENCOUNTER — Encounter: Payer: Self-pay | Admitting: Cardiology

## 2020-02-16 ENCOUNTER — Ambulatory Visit: Payer: 59 | Admitting: Cardiology

## 2020-02-16 VITALS — BP 122/90 | HR 63 | Ht 70.0 in | Wt 227.2 lb

## 2020-02-16 DIAGNOSIS — R072 Precordial pain: Secondary | ICD-10-CM | POA: Diagnosis not present

## 2020-02-16 DIAGNOSIS — E781 Pure hyperglyceridemia: Secondary | ICD-10-CM

## 2020-02-16 MED ORDER — METOPROLOL TARTRATE 100 MG PO TABS: 100.0000 mg | ORAL_TABLET | Freq: Once | ORAL | 0 refills | Status: DC

## 2020-02-16 NOTE — Progress Notes (Signed)
Cardiology Office Note:    Date:  02/16/2020   ID:  Vincent Padilla, DOB [birth year], MRN [id number]  PCP:  [name], NP  [facility] Cardiologist:  Kate Sable, MD  [place] Electrophysiologist:  None   Referring MD: [name], NP   Chief Complaint  Patient presents with  . Other    Chest tightness. Meds reviewed verbally with pt.   Vincent Padilla is a 50 y.o. male who is being seen today for the evaluation of chest pain at the request of [name], NP.   History of Present Illness:    Vincent Padilla is a 50 y.o. male with a hx of epilepsy, hypertriglyceridemia who presents due to chest pain.  He has been seen twice in the emergency room at [facility] on 12/17/2019 and 02/07/2020 due to symptoms of chest pain.  Work-up with EKG so far has been unrevealing.  Troponin obtained was normal.  Prior to his initial chest pain episode 2 months ago, he has never had symptoms of chest discomfort.  He was diagnosed with COVID-19 2 weeks ago, had symptoms of fatigue, fevers.  Since then he has had shortness of breath in addition to chest pain which he had before.  Describes symptoms as chest pressure, not related with exertion.  He denies any history of heart disease, denies smoking.  Positive family history of colon cancer, EGD and colonoscopy being planned by GI.  Past Medical History:  Diagnosis Date  . Arthritis    Present to neck  . Chicken pox   . COVID-19   . Elbow pain 10/05/2017  . Frequent headaches   . Hypertriglyceridemia   . Migraine     Past Surgical History:  Procedure Laterality Date  . COLONOSCOPY      Current Medications: Current Meds  Medication Sig  . carbamazepine (TEGRETOL) 200 MG tablet TAKE 1 TABLET BY MOUTH THREE TIMES DAILY  . metoprolol tartrate (LOPRESSOR) 100 MG tablet Take 1 tablet (100 mg total) by mouth once for 1 dose. Take 2 hours prior to your CT scan.  . omeprazole (PRILOSEC) 20 MG capsule Take 1 capsule (20  mg total) by mouth daily. For heartburn/cough. (Patient taking differently: Take 40 mg by mouth at bedtime. For heartburn/cough.)  . ondansetron (ZOFRAN) 4 MG tablet Take 1 tablet (4 mg total) by mouth every 8 (eight) hours as needed for nausea or vomiting.     Allergies:   Patient has no known allergies.   Social History   Socioeconomic History  . Marital status: Married    Spouse name: Not on file  . Number of children: Not on file  . Years of education: Not on file  . Highest education level: Not on file  Occupational History  . Not on file  Tobacco Use  . Smoking status: Never Smoker  . Smokeless tobacco: Never Used  Substance and Sexual Activity  . Alcohol use: No    Alcohol/week: 0.0 standard drinks  . Drug use: Never  . Sexual activity: Not on file  Other Topics Concern  . Not on file  Social History Narrative   Married.   Works as Conservation officer, nature.   Completed some college.   Enjoys Associate Professor guns, black smithing, working on cars.   Social Determinants of Health   Financial Resource Strain: Not on file  Food Insecurity: Not on file  Transportation Needs: Not on file  Physical Activity: Not on file  Stress: Not on file  Social  Connections: Not on file     Family History: The patient's family history includes Cancer in his father and paternal grandfather; Cancer (age of onset: 51) in his brother; Heart attack in his cousin and cousin.  ROS:   Please see the history of present illness.     All other systems reviewed and are negative.  EKGs/Labs/Other Studies Reviewed:    The following studies were reviewed today:   EKG:  EKG is  ordered today.  The ekg ordered today demonstrates normal sinus rhythm, nonspecific T wave changes  Recent Labs: No results found for requested labs within last 8760 hours.  Recent Lipid Panel    Component Value Date/Time   CHOL 189 01/02/2020 1033   TRIG 235.0 (H) 01/02/2020 1033   HDL 35.10 (L) 01/02/2020 1033   CHOLHDL 5  01/02/2020 1033   VLDL 47.0 (H) 01/02/2020 1033   LDLCALC 134 (H) 06/08/2016 0948   LDLDIRECT 107.0 01/02/2020 1033     Risk Assessment/Calculations:      Physical Exam:    VS:  BP 122/90 (BP Location: Right Arm, Patient Position: Sitting, Cuff Size: Normal)   Pulse 63   Ht 5' 10"  (1.778 m)   Wt 227 lb 4 oz (103.1 kg)   SpO2 98%   BMI 32.61 kg/m     Wt Readings from Last 3 Encounters:  02/16/20 227 lb 4 oz (103.1 kg)  01/02/20 239 lb 12.8 oz (108.8 kg)  02/08/18 240 lb 8 oz (109.1 kg)     GEN:  Well nourished, well developed in no acute distress HEENT: Normal NECK: No JVD; No carotid bruits LYMPHATICS: No lymphadenopathy CARDIAC: RRR, no murmurs, rubs, gallops RESPIRATORY:  Clear to auscultation without rales, wheezing or rhonchi  ABDOMEN: Soft, non-tender, non-distended MUSCULOSKELETAL:  No edema; No deformity  SKIN: Warm and dry NEUROLOGIC:  Alert and oriented x 3 PSYCHIATRIC:  Normal affect   ASSESSMENT:    1. Precordial pain   2. Pure hypertriglyceridemia    PLAN:    In order of problems listed above:  1. Chest pain, risk factors of hyperlipidemia.  Get echo to evaluate systolic function, coronary CTA to evaluate presence of CAD. 2. Hypertriglycerides, values are improved from prior.  Usually in the 700s, now 235.  Continue healthy diet.  If stays flat or becomes elevated, start fenofibrate.  Follow-up after echo and coronary CTA.       Medication Adjustments/Labs and Tests Ordered: Current medicines are reviewed at length with the patient today.  Concerns regarding medicines are outlined above.  Orders Placed This Encounter  Procedures  . CT CORONARY MORPH W/CTA COR W/SCORE W/CA W/CM &/OR WO/CM  . CT CORONARY FRACTIONAL FLOW RESERVE DATA PREP  . CT CORONARY FRACTIONAL FLOW RESERVE FLUID ANALYSIS  . EKG 12-Lead  . ECHOCARDIOGRAM COMPLETE   Meds ordered this encounter  Medications  . metoprolol tartrate (LOPRESSOR) 100 MG tablet    Sig: Take 1  tablet (100 mg total) by mouth once for 1 dose. Take 2 hours prior to your CT scan.    Dispense:  1 tablet    Refill:  0    Patient Instructions  Medication Instructions:  Your physician recommends that you continue on your current medications as directed. Please refer to the Current Medication list given to you today.  *If you need a refill on your cardiac medications before your next appointment, please call your pharmacy*   Lab Work: None ordered If you have labs (blood work) drawn today and  your tests are completely normal, you will receive your results only by: Marland Kitchen MyChart Message (if you have MyChart) OR . A paper copy in the mail If you have any lab test that is abnormal or we need to change your treatment, we will call you to review the results.   Testing/Procedures:  1.  Your physician has requested that you have an echocardiogram. Echocardiography is a painless test that uses sound waves to create images of your heart. It provides your doctor with information about the size and shape of your heart and how well your heart's chambers and valves are working. This procedure takes approximately one hour. There are no restrictions for this procedure.  2.  Your cardiac CT will be scheduled at:  [facility] [address] [place], [place] [address] [phone]   Please arrive 15 mins early for check-in and test prep.   Please follow these instructions carefully (unless otherwise directed):  Hold all erectile dysfunction medications at least 3 days (72 hrs) prior to test.    On the Night Before the Test: . Be sure to Drink plenty of water. . Do not consume any caffeinated/decaffeinated beverages or chocolate 12 hours prior to your test. . Do not take any antihistamines 12 hours prior to your test.    On the Day of the Test: . Drink plenty of water. Do not drink any water within one hour of the test. . Do not eat any food 4  hours prior to the test. . You may take your regular medications prior to the test.  . Take metoprolol (Lopressor) two hours prior to test.        After the Test: . Drink plenty of water. . After receiving IV contrast, you may experience a mild flushed feeling. This is normal. . On occasion, you may experience a mild rash up to 24 hours after the test. This is not dangerous. If this occurs, you can take Benadryl 25 mg and increase your fluid intake. . If you experience trouble breathing, this can be serious. If it is severe call 911 IMMEDIATELY. If it is mild, please call our office.    Once we have confirmed authorization from your insurance company, we will call you to set up a date and time for your test. Based on how quickly your insurance processes prior authorizations requests, please allow up to 4 weeks to be contacted for scheduling your Cardiac CT appointment. Be advised that routine Cardiac CT appointments could be scheduled as many as 8 weeks after your provider has ordered it.  For non-scheduling related questions, please contact the cardiac imaging nurse navigator should you have any questions/concerns: Marchia Bond, Cardiac Imaging Nurse Navigator Burley Saver, Interim Cardiac Imaging Nurse [place] [facility] Direct Office Dial: [phone]   For scheduling needs, including cancellations and rescheduling, please call [place], [phone].   Follow-Up: At [facility], you and your health needs are our priority.  As part of our continuing mission to provide you with exceptional heart care, we have created designated Provider Care Teams.  These Care Teams include your primary Cardiologist (physician) and Advanced Practice Providers (APPs -  Physician Assistants and Nurse Practitioners) who all work together to provide you with the care you need, when you need it.  We recommend signing up for the patient portal called "MyChart".  Sign up  information is provided on this After Visit Summary.  MyChart is used to connect with  patients for Virtual Visits (Telemedicine).  Patients are able to view lab/test results, encounter notes, upcoming appointments, etc.  Non-urgent messages can be sent to your provider as well.   To learn more about what you can do with MyChart, go to [email or web address].    Your next appointment:   Prior to 02/26/20  The format for your next appointment:   In Person  Provider:   Kate Sable, MD   Other Instructions      Signed, Kate Sable, MD  02/16/2020 4:40 PM    [place]

## 2020-02-16 NOTE — Patient Instructions (Addendum)
Medication Instructions:  Your physician recommends that you continue on your current medications as directed. Please refer to the Current Medication list given to you today.  *If you need a refill on your cardiac medications before your next appointment, please call your pharmacy*   Lab Work: None ordered If you have labs (blood work) drawn today and your tests are completely normal, you will receive your results only by: . MyChart Message (if you have MyChart) OR . A paper copy in the mail If you have any lab test that is abnormal or we need to change your treatment, we will call you to review the results.   Testing/Procedures:  1.  Your physician has requested that you have an echocardiogram. Echocardiography is a painless test that uses sound waves to create images of your heart. It provides your doctor with information about the size and shape of your heart and how well your heart's chambers and valves are working. This procedure takes approximately one hour. There are no restrictions for this procedure.  2.  Your cardiac CT will be scheduled at:  [facility] [address] [place], [place] [address] [phone]   Please arrive 15 mins early for check-in and test prep.   Please follow these instructions carefully (unless otherwise directed):  Hold all erectile dysfunction medications at least 3 days (72 hrs) prior to test.    On the Night Before the Test: . Be sure to Drink plenty of water. . Do not consume any caffeinated/decaffeinated beverages or chocolate 12 hours prior to your test. . Do not take any antihistamines 12 hours prior to your test.    On the Day of the Test: . Drink plenty of water. Do not drink any water within one hour of the test. . Do not eat any food 4 hours prior to the test. . You may take your regular medications prior to the test.  . Take metoprolol (Lopressor) two hours prior to test.         After the Test: . Drink plenty of water. . After receiving IV contrast, you may experience a mild flushed feeling. This is normal. . On occasion, you may experience a mild rash up to 24 hours after the test. This is not dangerous. If this occurs, you can take Benadryl 25 mg and increase your fluid intake. . If you experience trouble breathing, this can be serious. If it is severe call 911 IMMEDIATELY. If it is mild, please call our office.    Once we have confirmed authorization from your insurance company, we will call you to set up a date and time for your test. Based on how quickly your insurance processes prior authorizations requests, please allow up to 4 weeks to be contacted for scheduling your Cardiac CT appointment. Be advised that routine Cardiac CT appointments could be scheduled as many as 8 weeks after your provider has ordered it.  For non-scheduling related questions, please contact the cardiac imaging nurse navigator should you have any questions/concerns: Sara Wallace, Cardiac Imaging Nurse Navigator Merle Tai, Interim Cardiac Imaging Nurse Navigator [place] [facility] Direct Office Dial: [phone]   For scheduling needs, including cancellations and rescheduling, please call [place], [phone].   Follow-Up: At [facility], you and your health needs are our priority.  As part of our continuing mission to provide you with exceptional heart care, we have created designated Provider Care Teams.  These Care Teams include your primary Cardiologist (  physician) and Advanced Practice Providers (APPs -  Physician Assistants and Nurse Practitioners) who all work together to provide you with the care you need, when you need it.  We recommend signing up for the patient portal called "MyChart".  Sign up information is provided on this After Visit Summary.  MyChart is used to connect with patients for Virtual Visits (Telemedicine).  Patients are able to view  lab/test results, encounter notes, upcoming appointments, etc.  Non-urgent messages can be sent to your provider as well.   To learn more about what you can do with MyChart, go to [email or web address].    Your next appointment:   Prior to 02/26/20  The format for your next appointment:   In Person  Provider:   Kate Sable, MD   Other Instructions

## 2020-02-17 ENCOUNTER — Telehealth (HOSPITAL_COMMUNITY): Payer: Self-pay | Admitting: *Deleted

## 2020-02-17 ENCOUNTER — Ambulatory Visit (INDEPENDENT_AMBULATORY_CARE_PROVIDER_SITE_OTHER): Payer: 59

## 2020-02-17 DIAGNOSIS — R072 Precordial pain: Secondary | ICD-10-CM | POA: Diagnosis not present

## 2020-02-17 LAB — ECHOCARDIOGRAM COMPLETE
AR max vel: 3.7 cm2
AV Area VTI: 3.15 cm2
AV Area mean vel: 3.06 cm2
AV Mean grad: 4 mmHg
AV Peak grad: 8.4 mmHg
Ao pk vel: 1.45 m/s
Area-P 1/2: 3.26 cm2
Calc EF: 59.9 %
S' Lateral: 3.8 cm
Single Plane A2C EF: 60.7 %
Single Plane A4C EF: 58.9 %

## 2020-02-17 NOTE — Telephone Encounter (Signed)
Reaching out to patient to offer assistance regarding upcoming cardiac imaging study; pt verbalizes understanding of appt date/time, parking situation and where to check in, pre-test NPO status and medications ordered, and verified current allergies; name and call back number provided for further questions should they arise  Bryon Parker RN Navigator Cardiac Imaging [place] [facility] [phone] office [phone] cell  

## 2020-02-19 ENCOUNTER — Telehealth: Payer: Self-pay | Admitting: Cardiology

## 2020-02-19 ENCOUNTER — Ambulatory Visit
Admission: RE | Admit: 2020-02-19 | Discharge: 2020-02-19 | Disposition: A | Discharge status: Home / Self Care | Payer: 59 | Source: Ambulatory Visit | Attending: Cardiology | Admitting: Cardiology

## 2020-02-19 ENCOUNTER — Other Ambulatory Visit: Payer: Self-pay

## 2020-02-19 DIAGNOSIS — R072 Precordial pain: Secondary | ICD-10-CM | POA: Insufficient documentation

## 2020-02-19 MED ORDER — IOHEXOL 350 MG/ML SOLN
100.0000 mL | Freq: Once | INTRAVENOUS | Status: AC | PRN
  Administered 2020-02-19: 85 mL via INTRAVENOUS

## 2020-02-19 MED ORDER — NITROGLYCERIN 0.4 MG SL SUBL
0.8000 mg | SUBLINGUAL_TABLET | Freq: Once | SUBLINGUAL | Status: AC
  Administered 2020-02-19: 0.8 mg via SUBLINGUAL

## 2020-02-19 NOTE — Telephone Encounter (Signed)
Called patient and informed him that it was safe to take the Lopressor as prescribed. His heart rate was 63 when he was here for his office visit on 02/16/20. I explained that we need his heart rate nice and low to get optimal pictures with his scan.  Patient verbalized understanding and agreed with plan.

## 2020-02-19 NOTE — Telephone Encounter (Signed)
Patient calling in with questions and concerns about taking his medication before his CT this afternoon. Patient reports BP of 61 and is hesitant to take medication to lower his BP anymore  Please advise as patients CT is scheduled for 3:30 this afternoon

## 2020-02-19 NOTE — Progress Notes (Signed)
Patient tolerated procedure well. Ambulate w/o difficulty. Sitting in chair drinking water provided. Encouraged to drink extra water today and reasoning explained. Verbalized understanding. All questions answered. ABC intact. No further needs. Discharge from procedure area w/o issues.  

## 2020-02-23 ENCOUNTER — Other Ambulatory Visit: Payer: Self-pay

## 2020-02-23 ENCOUNTER — Encounter: Payer: Self-pay | Admitting: Cardiology

## 2020-02-23 ENCOUNTER — Ambulatory Visit: Payer: 59 | Admitting: Cardiology

## 2020-02-23 DIAGNOSIS — E781 Pure hyperglyceridemia: Secondary | ICD-10-CM | POA: Diagnosis not present

## 2020-02-23 DIAGNOSIS — R072 Precordial pain: Secondary | ICD-10-CM | POA: Diagnosis not present

## 2020-02-23 NOTE — Patient Instructions (Signed)
Medication Instructions:  Your physician recommends that you continue on your current medications as directed. Please refer to the Current Medication list given to you today.  *If you need a refill on your cardiac medications before your next appointment, please call your pharmacy*   Lab Work:  Your physician recommends that you return for a FASTING lipid profile: just prior to your follow up appointment.  - You will need to be fasting. Please do not have anything to eat or drink after midnight the morning you have the lab work. You may only have water or black coffee with no cream or sugar. - Please go to the [facility]. You will check in at the front desk to the right as you walk into the atrium. Valet Parking is offered if needed. - No appointment needed. You may go any day between 7 am and 6 pm.   Testing/Procedures: None ordered   Follow-Up: At [facility], you and your health needs are our priority.  As part of our continuing mission to provide you with exceptional heart care, we have created designated Provider Care Teams.  These Care Teams include your primary Cardiologist (physician) and Advanced Practice Providers (APPs -  Physician Assistants and Nurse Practitioners) who all work together to provide you with the care you need, when you need it.  We recommend signing up for the patient portal called "MyChart".  Sign up information is provided on this After Visit Summary.  MyChart is used to connect with patients for Virtual Visits (Telemedicine).  Patients are able to view lab/test results, encounter notes, upcoming appointments, etc.  Non-urgent messages can be sent to your provider as well.   To learn more about what you can do with MyChart, go to [email or web address].    Your next appointment:   3 month(s)  The format for your next appointment:   In Person  Provider:   Kate Sable, MD   Other Instructions

## 2020-02-23 NOTE — Progress Notes (Signed)
Cardiology Office Note:    Date:  02/23/2020   ID:  Vincent Padilla, DOB Jul 25, 1970, MRN [id number]  PCP:  [name], NP  [facility] Cardiologist:  Kate Sable, MD  [place] Electrophysiologist:  None   Referring MD: [name], NP   Chief Complaint  Patient presents with  . Other    Follow up post ECHO. Meds reviewed verbally with patient.     History of Present Illness:    Vincent Padilla is a 50 y.o. male with a hx of epilepsy, hypertriglyceridemia who presents for follow-up.  Last seen due to chest pain requiring multiple ED visits.  Echo and coronary CTA was performed to evaluate presence of CAD.  Now presents for follow-up.  Patient states doing okay, has no concerns at this time.   Past Medical History:  Diagnosis Date  . Arthritis    Present to neck  . Chicken pox   . COVID-19   . Elbow pain 10/05/2017  . Frequent headaches   . Hypertriglyceridemia   . Migraine     Past Surgical History:  Procedure Laterality Date  . COLONOSCOPY      Current Medications: Current Meds  Medication Sig  . carbamazepine (TEGRETOL) 200 MG tablet TAKE 1 TABLET BY MOUTH THREE TIMES DAILY  . omeprazole (PRILOSEC) 20 MG capsule Take 40 mg by mouth daily.  . ondansetron (ZOFRAN) 4 MG tablet Take 1 tablet (4 mg total) by mouth every 8 (eight) hours as needed for nausea or vomiting.     Allergies:   Patient has no known allergies.   Social History   Socioeconomic History  . Marital status: Married    Spouse name: Not on file  . Number of children: Not on file  . Years of education: Not on file  . Highest education level: Not on file  Occupational History  . Not on file  Tobacco Use  . Smoking status: Never Smoker  . Smokeless tobacco: Never Used  Substance and Sexual Activity  . Alcohol use: No    Alcohol/week: 0.0 standard drinks  . Drug use: Never  . Sexual activity: Not on file  Other Topics Concern  . Not on file  Social History  Narrative   Married.   Works as Conservation officer, nature.   Completed some college.   Enjoys Associate Professor guns, black smithing, working on cars.   Social Determinants of Health   Financial Resource Strain: Not on file  Food Insecurity: Not on file  Transportation Needs: Not on file  Physical Activity: Not on file  Stress: Not on file  Social Connections: Not on file     Family History: The patient's family history includes Cancer in his father and paternal grandfather; Cancer (age of onset: 69) in his brother; Heart attack in his cousin and cousin.  ROS:   Please see the history of present illness.     All other systems reviewed and are negative.  EKGs/Labs/Other Studies Reviewed:    The following studies were reviewed today:   EKG:  EKG is  ordered today.  The ekg ordered today demonstrates normal sinus rhythm, nonspecific T wave changes  Recent Labs: No results found for requested labs within last 8760 hours.  Recent Lipid Panel    Component Value Date/Time   CHOL 189 01/02/2020 1033   TRIG 235.0 (H) 01/02/2020 1033   HDL 35.10 (L) 01/02/2020 1033   CHOLHDL 5 01/02/2020 1033   VLDL 47.0 (H) 01/02/2020 1033   [place]  134 (H) 06/08/2016 0948   LDLDIRECT 107.0 01/02/2020 1033     Risk Assessment/Calculations:      Physical Exam:    VS:  BP 126/80 (BP Location: Left Arm, Patient Position: Sitting, Cuff Size: Normal)   Pulse 60   Ht 5\' 10"  (1.778 m)   Wt 226 lb (102.5 kg)   SpO2 96%   BMI 32.43 kg/m     Wt Readings from Last 3 Encounters:  02/23/20 226 lb (102.5 kg)  02/16/20 227 lb 4 oz (103.1 kg)  01/02/20 239 lb 12.8 oz (108.8 kg)     GEN:  Well nourished, well developed in no acute distress HEENT: Normal NECK: No JVD; No carotid bruits LYMPHATICS: No lymphadenopathy CARDIAC: RRR, no murmurs, rubs, gallops RESPIRATORY:  Clear to auscultation without rales, wheezing or rhonchi  ABDOMEN: Soft, non-tender, non-distended MUSCULOSKELETAL:  No edema; No deformity   SKIN: Warm and dry NEUROLOGIC:  Alert and oriented x 3 PSYCHIATRIC:  Normal affect   ASSESSMENT:    1. Precordial pain   2. Pure hypertriglyceridemia    PLAN:    In order of problems listed above:  1. Chest pain, risk factors of hyperlipidemia.  Echocardiogram showed.  Normal systolic and diastolic function, EF 55 to 60%.  Coronary CTA with calcium score of 0, no evidence of CAD. 2. Hypertriglycerides, values are improved from prior.  Repeat fasting lipid profile in 3 months.  If triglycerides still elevated, plan to start fenofibrate.  Low-cholesterol diet advised.  Follow-up in 3 months after fasting lipid profile.   Medication Adjustments/Labs and Tests Ordered: Current medicines are reviewed at length with the patient today.  Concerns regarding medicines are outlined above.  Orders Placed This Encounter  Procedures  . Lipid panel   No orders of the defined types were placed in this encounter.   Patient Instructions  Medication Instructions:  Your physician recommends that you continue on your current medications as directed. Please refer to the Current Medication list given to you today.  *If you need a refill on your cardiac medications before your next appointment, please call your pharmacy*   Lab Work:  Your physician recommends that you return for a FASTING lipid profile: just prior to your follow up appointment.  - You will need to be fasting. Please do not have anything to eat or drink after midnight the morning you have the lab work. You may only have water or black coffee with no cream or sugar. - Please go to the [facility]. You will check in at the front desk to the right as you walk into the atrium. Valet Parking is offered if needed. - No appointment needed. You may go any day between 7 am and 6 pm.   Testing/Procedures: None ordered   Follow-Up: At [facility], you and your health needs are our priority.  As part of our continuing mission  to provide you with exceptional heart care, we have created designated Provider Care Teams.  These Care Teams include your primary Cardiologist (physician) and Advanced Practice Providers (APPs -  Physician Assistants and Nurse Practitioners) who all work together to provide you with the care you need, when you need it.  We recommend signing up for the patient portal called "MyChart".  Sign up information is provided on this After Visit Summary.  MyChart is used to connect with patients for Virtual Visits (Telemedicine).  Patients are able to view lab/test results, encounter notes, upcoming appointments, etc.  Non-urgent messages can be  sent to your provider as well.   To learn more about what you can do with MyChart, go to [email or web address].    Your next appointment:   3 month(s)  The format for your next appointment:   In Person  Provider:   Kate Sable, MD   Other Instructions      Signed, Kate Sable, MD  02/23/2020 4:37 PM    [place]

## 2020-02-24 ENCOUNTER — Other Ambulatory Visit: Admission: RE | Admit: 2020-02-24 | Payer: 59 | Source: Ambulatory Visit

## 2020-02-25 NOTE — Discharge Instructions (Signed)

## 2020-02-26 ENCOUNTER — Encounter
Admission: RE | Disposition: A | Discharge status: Home / Self Care | Payer: Self-pay | Source: Home / Self Care | Attending: Gastroenterology

## 2020-02-26 ENCOUNTER — Ambulatory Visit: Payer: 59 | Admitting: Anesthesiology

## 2020-02-26 ENCOUNTER — Other Ambulatory Visit: Payer: Self-pay

## 2020-02-26 ENCOUNTER — Ambulatory Visit
Admission: RE | Admit: 2020-02-26 | Discharge: 2020-02-26 | Disposition: A | Discharge status: Home / Self Care | Payer: 59 | Attending: Gastroenterology | Admitting: Gastroenterology

## 2020-02-26 ENCOUNTER — Encounter: Payer: Self-pay | Admitting: Gastroenterology

## 2020-02-26 DIAGNOSIS — K219 Gastro-esophageal reflux disease without esophagitis: Secondary | ICD-10-CM | POA: Diagnosis not present

## 2020-02-26 DIAGNOSIS — R12 Heartburn: Secondary | ICD-10-CM | POA: Insufficient documentation

## 2020-02-26 DIAGNOSIS — K2950 Unspecified chronic gastritis without bleeding: Secondary | ICD-10-CM | POA: Insufficient documentation

## 2020-02-26 DIAGNOSIS — R1013 Epigastric pain: Secondary | ICD-10-CM | POA: Diagnosis not present

## 2020-02-26 DIAGNOSIS — Z8616 Personal history of COVID-19: Secondary | ICD-10-CM | POA: Insufficient documentation

## 2020-02-26 DIAGNOSIS — D126 Benign neoplasm of colon, unspecified: Secondary | ICD-10-CM | POA: Diagnosis not present

## 2020-02-26 DIAGNOSIS — Z1211 Encounter for screening for malignant neoplasm of colon: Secondary | ICD-10-CM

## 2020-02-26 DIAGNOSIS — Z87891 Personal history of nicotine dependence: Secondary | ICD-10-CM | POA: Insufficient documentation

## 2020-02-26 DIAGNOSIS — D123 Benign neoplasm of transverse colon: Secondary | ICD-10-CM | POA: Diagnosis not present

## 2020-02-26 DIAGNOSIS — D120 Benign neoplasm of cecum: Secondary | ICD-10-CM | POA: Diagnosis not present

## 2020-02-26 DIAGNOSIS — K635 Polyp of colon: Secondary | ICD-10-CM

## 2020-02-26 DIAGNOSIS — B9681 Helicobacter pylori [H. pylori] as the cause of diseases classified elsewhere: Secondary | ICD-10-CM | POA: Insufficient documentation

## 2020-02-26 DIAGNOSIS — D122 Benign neoplasm of ascending colon: Secondary | ICD-10-CM | POA: Insufficient documentation

## 2020-02-26 DIAGNOSIS — Z79899 Other long term (current) drug therapy: Secondary | ICD-10-CM | POA: Insufficient documentation

## 2020-02-26 DIAGNOSIS — K2900 Acute gastritis without bleeding: Secondary | ICD-10-CM | POA: Diagnosis not present

## 2020-02-26 HISTORY — PX: BIOPSY: SHX5522

## 2020-02-26 HISTORY — PX: POLYPECTOMY: SHX5525

## 2020-02-26 HISTORY — PX: COLONOSCOPY WITH PROPOFOL: SHX5780

## 2020-02-26 HISTORY — PX: ESOPHAGOGASTRODUODENOSCOPY (EGD) WITH PROPOFOL: SHX5813

## 2020-02-26 HISTORY — DX: COVID-19: U07.1

## 2020-02-26 SURGERY — COLONOSCOPY WITH PROPOFOL
Anesthesia: General | Site: Throat

## 2020-02-26 MED ORDER — PROPOFOL 10 MG/ML IV BOLUS
INTRAVENOUS | Status: DC | PRN
  Administered 2020-02-26 (×2): 20 mg via INTRAVENOUS
  Administered 2020-02-26: 80 mg via INTRAVENOUS
  Administered 2020-02-26: 30 mg via INTRAVENOUS
  Administered 2020-02-26 (×4): 20 mg via INTRAVENOUS
  Administered 2020-02-26: 30 mg via INTRAVENOUS
  Administered 2020-02-26: 20 mg via INTRAVENOUS
  Administered 2020-02-26 (×2): 30 mg via INTRAVENOUS
  Administered 2020-02-26 (×2): 20 mg via INTRAVENOUS
  Administered 2020-02-26: 30 mg via INTRAVENOUS
  Administered 2020-02-26: 20 mg via INTRAVENOUS

## 2020-02-26 MED ORDER — LACTATED RINGERS IV SOLN: INTRAVENOUS | Status: DC

## 2020-02-26 MED ORDER — GLYCOPYRROLATE 0.2 MG/ML IJ SOLN
INTRAMUSCULAR | Status: DC | PRN
  Administered 2020-02-26: .1 mg via INTRAVENOUS

## 2020-02-26 MED ORDER — ACETAMINOPHEN 325 MG PO TABS
325.0000 mg | ORAL_TABLET | ORAL | Status: DC | PRN
Start: 2020-02-26 — End: 2020-02-26

## 2020-02-26 MED ORDER — ACETAMINOPHEN 160 MG/5ML PO SOLN: 325.0000 mg | ORAL | Status: DC | PRN

## 2020-02-26 MED ORDER — LIDOCAINE HCL (CARDIAC) PF 100 MG/5ML IV SOSY
PREFILLED_SYRINGE | INTRAVENOUS | Status: DC | PRN
  Administered 2020-02-26: 50 mg via INTRAVENOUS

## 2020-02-26 MED ORDER — STERILE WATER FOR IRRIGATION IR SOLN
Status: DC | PRN
  Administered 2020-02-26: 100 mL

## 2020-02-26 MED ORDER — ONDANSETRON HCL 4 MG/2ML IJ SOLN: 4.0000 mg | Freq: Once | INTRAMUSCULAR | Status: DC | PRN

## 2020-02-26 SURGICAL SUPPLY — 38 items
BALLN DILATOR 10-12 8 (BALLOONS)
BALLN DILATOR 12-15 8 (BALLOONS)
BALLN DILATOR 15-18 8 (BALLOONS)
BALLN DILATOR CRE 0-12 8 (BALLOONS)
BALLN DILATOR ESOPH 8 10 CRE (MISCELLANEOUS) IMPLANT
BALLOON DILATOR 12-15 8 (BALLOONS) IMPLANT
BALLOON DILATOR 15-18 8 (BALLOONS) IMPLANT
BALLOON DILATOR CRE 0-12 8 (BALLOONS) IMPLANT
BLOCK BITE 60FR ADLT L/F GRN (MISCELLANEOUS) ×3 IMPLANT
CLIP HMST 235XBRD CATH ROT (MISCELLANEOUS) IMPLANT
CLIP RESOLUTION 360 11X235 (MISCELLANEOUS)
ELECT REM PT RETURN 9FT ADLT (ELECTROSURGICAL)
ELECTRODE REM PT RTRN 9FT ADLT (ELECTROSURGICAL) IMPLANT
FCP ESCP3.2XJMB 240X2.8X (MISCELLANEOUS)
FORCEPS BIOP RAD 4 LRG CAP 4 (CUTTING FORCEPS) ×3 IMPLANT
FORCEPS BIOP RJ4 240 W/NDL (MISCELLANEOUS)
FORCEPS ESCP3.2XJMB 240X2.8X (MISCELLANEOUS) IMPLANT
GOWN CVR UNV OPN BCK APRN NK (MISCELLANEOUS) ×4 IMPLANT
GOWN ISOL THUMB LOOP REG UNIV (MISCELLANEOUS) ×6
INJECTOR VARIJECT VIN23 (MISCELLANEOUS) IMPLANT
KIT DEFENDO VALVE AND CONN (KITS) IMPLANT
KIT PRC NS LF DISP ENDO (KITS) ×4 IMPLANT
KIT PROCEDURE OLYMPUS (KITS) ×6
MANIFOLD NEPTUNE II (INSTRUMENTS) ×3 IMPLANT
MARKER SPOT ENDO TATTOO 5ML (MISCELLANEOUS) IMPLANT
PROBE APC STR FIRE (PROBE) IMPLANT
RETRIEVER NET PLAT FOOD (MISCELLANEOUS) IMPLANT
RETRIEVER NET ROTH 2.5X230 LF (MISCELLANEOUS) IMPLANT
SNARE COLD EXACTO (MISCELLANEOUS) ×3 IMPLANT
SNARE SHORT THROW 13M SML OVAL (MISCELLANEOUS) IMPLANT
SNARE SHORT THROW 30M LRG OVAL (MISCELLANEOUS) IMPLANT
SNARE SNG USE RND 15MM (INSTRUMENTS) IMPLANT
SPOT EX ENDOSCOPIC TATTOO (MISCELLANEOUS)
SYR INFLATION 60ML (SYRINGE) IMPLANT
TRAP ETRAP POLY (MISCELLANEOUS) ×3 IMPLANT
VARIJECT INJECTOR VIN23 (MISCELLANEOUS)
WATER STERILE IRR 250ML POUR (IV SOLUTION) ×3 IMPLANT
WIRE CRE 18-20MM 8CM F G (MISCELLANEOUS) IMPLANT

## 2020-02-26 NOTE — Anesthesia Preprocedure Evaluation (Signed)
Anesthesia Evaluation  Patient identified by MRN, date of birth, ID band Patient awake    Reviewed: Allergy & Precautions, NPO status , Patient's Chart, lab work & pertinent test results  Airway Mallampati: II  TM Distance: >3 FB     Dental   Pulmonary neg recent URI, former smoker,    breath sounds clear to auscultation       Cardiovascular hypertension,  Rhythm:Regular Rate:Normal     Neuro/Psych  Headaches, Seizures -,     GI/Hepatic GERD  ,  Endo/Other  Hypothyroidism Obesity - BMI 32  Renal/GU      Musculoskeletal  (+) Arthritis ,   Abdominal   Peds  Hematology   Anesthesia Other Findings   Reproductive/Obstetrics                             Anesthesia Physical Anesthesia Plan  ASA: II  Anesthesia Plan: General   Post-op Pain Management:    Induction: Intravenous  PONV Risk Score and Plan: Propofol infusion, TIVA and Treatment may vary due to age or medical condition  Airway Management Planned: Natural Airway and Nasal Cannula  Additional Equipment:   Intra-op Plan:   Post-operative Plan:   Informed Consent: I have reviewed the patients History and Physical, chart, labs and discussed the procedure including the risks, benefits and alternatives for the proposed anesthesia with the patient or authorized representative who has indicated his/her understanding and acceptance.       Plan Discussed with: CRNA  Anesthesia Plan Comments:         Anesthesia Quick Evaluation

## 2020-02-26 NOTE — Op Note (Signed)
[facility] Gastroenterology Patient Name: Vincent Padilla Procedure Date: 02/26/2020 7:29 AM MRN: [id number] Account #: [id number] Date of Birth: 1970-01-28 Admit Type: Outpatient Age: 50 Room: [facility] OR ROOM 01 Gender: Male Note Status: Finalized Procedure:             Colonoscopy Indications:           Screening for colorectal malignant neoplasm Providers:             Lucilla Lame MD, MD Referring MD:          [name] (Referring MD) Medicines:             Propofol per Anesthesia Complications:         No immediate complications. Procedure:             Pre-Anesthesia Assessment:                        - Prior to the procedure, a History and Physical was                         performed, and patient medications and allergies were                         reviewed. The patient's tolerance of previous                         anesthesia was also reviewed. The risks and benefits                         of the procedure and the sedation options and risks                         were discussed with the patient. All questions were                         answered, and informed consent was obtained. Prior                         Anticoagulants: The patient has taken no previous                         anticoagulant or antiplatelet agents. ASA Grade                         Assessment: II - A patient with mild systemic disease.                         After reviewing the risks and benefits, the patient                         was deemed in satisfactory condition to undergo the                         procedure.                        After obtaining informed consent, the colonoscope was  passed under direct vision. Throughout the procedure,                         the patient's blood pressure, pulse, and oxygen                         saturations were monitored continuously. The                         Colonoscope was introduced through the  anus and                         advanced to the the cecum, identified by appendiceal                         orifice and ileocecal valve. The colonoscopy was                         performed without difficulty. The patient tolerated                         the procedure well. The quality of the bowel                         preparation was good. Findings:      The perianal and digital rectal examinations were normal.      Two sessile polyps were found in the cecum. The polyps were 2 to 7 mm in       size. These polyps were removed with a cold snare. Resection and       retrieval were complete.      A 4 mm polyp was found in the ascending colon. The polyp was sessile.       The polyp was removed with a cold snare. Resection and retrieval were       complete.      Two sessile polyps were found in the transverse colon. The polyps were 3       to 5 mm in size. These polyps were removed with a cold snare. Resection       and retrieval were complete.      A 2 mm polyp was found in the transverse colon. The polyp was sessile.       The polyp was removed with a cold biopsy forceps. Resection and       retrieval were complete. Impression:            - Two 2 to 7 mm polyps in the cecum, removed with a                         cold snare. Resected and retrieved.                        - One 4 mm polyp in the ascending colon, removed with                         a cold snare. Resected and retrieved.                        - Two 3 to 5 mm polyps in the transverse  colon,                         removed with a cold snare. Resected and retrieved.                        - One 2 mm polyp in the transverse colon, removed with                         a cold biopsy forceps. Resected and retrieved. Recommendation:        - Discharge patient to home.                        - Resume previous diet.                        - Continue present medications.                        - Await pathology results.                         - Repeat colonoscopy in 5 years if polyp adenoma and                         10 years if hyperplastic Procedure Code(s):     --- Professional ---                        [phone], Colonoscopy, flexible; with removal of                         tumor(s), polyp(s), or other lesion(s) by snare                         technique                        [id number], 60, Colonoscopy, flexible; with biopsy, single                         or multiple Diagnosis Code(s):     --- Professional ---                        Z12.11, Encounter for screening for malignant neoplasm                         of colon                        K63.5, Polyp of colon CPT copyright 2019 American Medical Association. All rights reserved. The codes documented in this report are preliminary and upon coder review may  be revised to meet current compliance requirements. Lucilla Lame MD, MD 02/26/2020 8:23:47 AM This report has been signed electronically. Number of Addenda: 0 Note Initiated On: 02/26/2020 7:29 AM Scope Withdrawal Time: 0 hours 14 minutes 19 seconds  Total Procedure Duration: 0 hours 16 minutes 8 seconds  Estimated Blood Loss:  Estimated blood loss: none.      [facility]

## 2020-02-26 NOTE — Anesthesia Procedure Notes (Signed)
Performed by: Aleyssa Pike, CRNA Pre-anesthesia Checklist: Patient identified, Emergency Drugs available, Suction available, Timeout performed and Patient being monitored Patient Re-evaluated:Patient Re-evaluated prior to induction Oxygen Delivery Method: Nasal cannula Placement Confirmation: positive ETCO2       

## 2020-02-26 NOTE — Op Note (Signed)
[facility] Gastroenterology Patient Name: Vincent Padilla Procedure Date: 02/26/2020 7:27 AM MRN: [id number] Account #: [id number] Date of Birth: [birth year] Admit Type: Outpatient Age: 50 Room: [facility] OR ROOM 01 Gender: Male Note Status: Finalized Procedure:             Upper GI endoscopy Indications:           Epigastric abdominal pain, Heartburn Providers:             Lucilla Lame MD, MD Referring MD:          [name] (Referring MD) Medicines:             Propofol per Anesthesia Complications:         No immediate complications. Procedure:             Pre-Anesthesia Assessment:                        - Prior to the procedure, a History and Physical was                         performed, and patient medications and allergies were                         reviewed. The patient's tolerance of previous                         anesthesia was also reviewed. The risks and benefits                         of the procedure and the sedation options and risks                         were discussed with the patient. All questions were                         answered, and informed consent was obtained. Prior                         Anticoagulants: The patient has taken no previous                         anticoagulant or antiplatelet agents. ASA Grade                         Assessment: II - A patient with mild systemic disease.                         After reviewing the risks and benefits, the patient                         was deemed in satisfactory condition to undergo the                         procedure.                        After obtaining informed consent, the endoscope was  passed under direct vision. Throughout the procedure,                         the patient's blood pressure, pulse, and oxygen                         saturations were monitored continuously. The was                         introduced through the mouth, and  advanced to the                         second part of duodenum. The upper GI endoscopy was                         accomplished without difficulty. The patient tolerated                         the procedure well. Findings:      The Z-line was irregular and was found at the gastroesophageal junction.      The entire examined stomach was normal. Biopsies were taken with a cold       forceps for histology.      The examined duodenum was normal. Impression:            - Z-line irregular, at the gastroesophageal junction.                        - Normal stomach. Biopsied.                        - Normal examined duodenum. Recommendation:        - Discharge patient to home.                        - Resume previous diet.                        - Continue present medications.                        - Await pathology results.                        - Perform a colonoscopy today. Procedure Code(s):     --- Professional ---                        [phone], Esophagogastroduodenoscopy, flexible,                         transoral; with biopsy, single or multiple Diagnosis Code(s):     --- Professional ---                        R10.13, Epigastric pain CPT copyright 2019 American Medical Association. All rights reserved. The codes documented in this report are preliminary and upon coder review may  be revised to meet current compliance requirements. Lucilla Lame MD, MD 02/26/2020 8:02:59 AM This report has been signed electronically. Number of Addenda: 0 Note Initiated On: 02/26/2020 7:27 AM Total Procedure Duration: 0 hours 2 minutes 9  seconds  Estimated Blood Loss:  Estimated blood loss: none.      [facility]

## 2020-02-26 NOTE — H&P (Signed)
Lucilla Lame, MD [facility] [address], [place], [place] [address] Phone:[phone] Fax : [phone]  Primary Care Physician:  [name], NP Primary Gastroenterologist:  Dr. Allen Norris  Pre-Procedure History & Physical: HPI:  Vincent Padilla is a 51 y.o. male is here for an endoscopy and colonoscopy.   Past Medical History:  Diagnosis Date  . Arthritis    Present to neck  . Chicken pox   . COVID-19   . Elbow pain 10/05/2017  . Frequent headaches   . Hyperlipidemia 06/08/2016  . Hypertriglyceridemia   . Migraine   . Seizure disorder ([place]) 05/22/2014    Past Surgical History:  Procedure Laterality Date  . COLONOSCOPY      Prior to Admission medications   Medication Sig Start Date End Date Taking? Authorizing Provider  carbamazepine (TEGRETOL) 200 MG tablet TAKE 1 TABLET BY MOUTH THREE TIMES DAILY 02/19/19  Yes [name], NP  ondansetron (ZOFRAN) 4 MG tablet Take 1 tablet (4 mg total) by mouth every 8 (eight) hours as needed for nausea or vomiting. 02/06/20  Yes Bedsole, Amy E, MD  metoprolol tartrate (LOPRESSOR) 100 MG tablet Take 1 tablet (100 mg total) by mouth once for 1 dose. Take 2 hours prior to your CT scan. Patient not taking: Reported on 02/26/2020 02/16/20 02/16/20  Kate Sable, MD  omeprazole (PRILOSEC) 20 MG capsule Take 40 mg by mouth daily.    [provider]    Allergies as of 02/04/2020  . (No Known Allergies)    Family History  Problem Relation Age of Onset  . Cancer Brother 78       colon, deceased  . Cancer Paternal Grandfather   . Cancer Father   . Heart attack Cousin   . Heart attack Cousin     Social History   Socioeconomic History  . Marital status: Married    Spouse name: Not on file  . Number of children: Not on file  . Years of education: Not on file  . Highest education level: Not on file  Occupational History  . Not on file  Tobacco Use  . Smoking status: Never Smoker  . Smokeless tobacco: Never  Used  Substance and Sexual Activity  . Alcohol use: No    Alcohol/week: 0.0 standard drinks  . Drug use: Never  . Sexual activity: Not on file  Other Topics Concern  . Not on file  Social History Narrative   Married.   Works as Conservation officer, nature.   Completed some college.   Enjoys Associate Professor guns, black smithing, working on cars.   Social Determinants of Health   Financial Resource Strain: Not on file  Food Insecurity: Not on file  Transportation Needs: Not on file  Physical Activity: Not on file  Stress: Not on file  Social Connections: Not on file  Intimate Partner Violence: Not on file    Review of Systems: See HPI, otherwise negative ROS  Physical Exam: BP 131/85   Pulse 61   Temp (!) 97.2 F (36.2 C) (Temporal)   Resp 18   Ht 5\' 10"  (1.778 m)   Wt 101.2 kg   SpO2 95%   BMI 32.00 kg/m  General:   Alert,  pleasant and cooperative in NAD Head:  Normocephalic and atraumatic. Neck:  Supple; no masses or thyromegaly. Lungs:  Clear throughout to auscultation.    Heart:  Regular rate and rhythm. Abdomen:  Soft, nontender and nondistended. Normal bowel sounds, without guarding, and without rebound.  Neurologic:  Alert and  oriented x4;  grossly normal neurologically.  Impression/Plan: MORELL MEARS is here for an endoscopy and colonoscopy to be performed for GERD and screening  Risks, benefits, limitations, and alternatives regarding  endoscopy and colonoscopy have been reviewed with the patient.  Questions have been answered.  All parties agreeable.   Lucilla Lame, MD  02/26/2020, 7:40 AM

## 2020-02-26 NOTE — Transfer of Care (Signed)
Immediate Anesthesia Transfer of Care Note  Patient: Vincent Padilla  Procedure(s) Performed: COLONOSCOPY WITH PROPOFOL (N/A Rectum) ESOPHAGOGASTRODUODENOSCOPY (EGD) WITH PROPOFOL (N/A Throat) BIOPSY (N/A Throat) POLYPECTOMY (N/A Rectum)  Patient Location: PACU  Anesthesia Type: General  Level of Consciousness: awake, alert  and patient cooperative  Airway and Oxygen Therapy: Patient Spontanous Breathing and Patient connected to supplemental oxygen  Post-op Assessment: Post-op Vital signs reviewed, Patient's Cardiovascular Status Stable, Respiratory Function Stable, Patent Airway and No signs of Nausea or vomiting  Post-op Vital Signs: Reviewed and stable  Complications: No complications documented.

## 2020-02-26 NOTE — Anesthesia Postprocedure Evaluation (Signed)
Anesthesia Post Note  Patient: Vincent Padilla  Procedure(s) Performed: COLONOSCOPY WITH PROPOFOL (N/A Rectum) ESOPHAGOGASTRODUODENOSCOPY (EGD) WITH PROPOFOL (N/A Throat) BIOPSY (N/A Throat) POLYPECTOMY (N/A Rectum)     Patient location during evaluation: PACU Anesthesia Type: General Level of consciousness: awake Pain management: pain level controlled Vital Signs Assessment: post-procedure vital signs reviewed and stable Respiratory status: respiratory function stable Cardiovascular status: stable Postop Assessment: no signs of nausea or vomiting Anesthetic complications: no   No complications documented.  Veda Canning

## 2020-02-27 LAB — SURGICAL PATHOLOGY

## 2020-02-28 ENCOUNTER — Encounter: Payer: Self-pay | Admitting: Gastroenterology

## 2020-03-01 ENCOUNTER — Telehealth: Payer: Self-pay

## 2020-03-01 ENCOUNTER — Other Ambulatory Visit: Payer: Self-pay

## 2020-03-01 NOTE — Telephone Encounter (Signed)
Patient is calling about colonoscopy and EGD report. Please call patient

## 2020-03-01 NOTE — Telephone Encounter (Signed)
Patient LM on VM asking for call back.  LM to let him know that phone note had been sent and that nurse will call him back as soon as possible

## 2020-03-02 ENCOUNTER — Telehealth: Payer: Self-pay

## 2020-03-02 ENCOUNTER — Other Ambulatory Visit: Payer: Self-pay

## 2020-03-02 MED ORDER — TETRACYCLINE HCL 500 MG PO CAPS: 500.0000 mg | ORAL_CAPSULE | Freq: Four times a day (QID) | ORAL | 0 refills | Status: AC

## 2020-03-02 MED ORDER — AMOXICILLIN 500 MG PO CAPS: 1000.0000 mg | ORAL_CAPSULE | Freq: Two times a day (BID) | ORAL | 0 refills | Status: DC

## 2020-03-02 MED ORDER — CLARITHROMYCIN 500 MG PO TABS: 500.0000 mg | ORAL_TABLET | Freq: Two times a day (BID) | ORAL | 0 refills | Status: DC

## 2020-03-02 MED ORDER — METRONIDAZOLE 250 MG PO TABS: 250.0000 mg | ORAL_TABLET | Freq: Four times a day (QID) | ORAL | 0 refills | Status: AC

## 2020-03-02 MED ORDER — PYLERA 140-125-125 MG PO CAPS: 3.0000 | ORAL_CAPSULE | Freq: Three times a day (TID) | ORAL | 0 refills | Status: DC

## 2020-03-02 NOTE — Telephone Encounter (Signed)
Pt notified of colonoscopy/EGD results. Rx's for Amoxicillin and Clarithromycin has been sent to the pharmacy. Pt advised we will recheck H pylori status 6 weeks after he completes his treatment.

## 2020-03-02 NOTE — Telephone Encounter (Signed)
Pt notified of results

## 2020-03-02 NOTE — Telephone Encounter (Signed)
-----   Message from Lucilla Lame, MD sent at 02/28/2020  2:36 PM EST ----- The patient's polyps were adenomas and he will need a colonoscopy in 5 years.  The patient's stomach biopsy showed H. pylori the patient needs to be treated for this.

## 2020-05-24 ENCOUNTER — Ambulatory Visit: Payer: 59 | Admitting: Cardiology

## 2020-05-25 ENCOUNTER — Encounter: Payer: Self-pay | Admitting: Cardiology

## 2020-06-28 ENCOUNTER — Other Ambulatory Visit: Payer: Self-pay

## 2020-06-28 ENCOUNTER — Ambulatory Visit
Admission: RE | Admit: 2020-06-28 | Discharge: 2020-06-28 | Disposition: A | Discharge status: Home / Self Care | Payer: 59 | Attending: Family Medicine | Admitting: Family Medicine

## 2020-06-28 ENCOUNTER — Ambulatory Visit: Payer: Self-pay | Admitting: Family Medicine

## 2020-06-28 ENCOUNTER — Encounter: Payer: Self-pay | Admitting: Family Medicine

## 2020-06-28 ENCOUNTER — Other Ambulatory Visit: Payer: Self-pay | Admitting: Family Medicine

## 2020-06-28 ENCOUNTER — Ambulatory Visit
Admission: RE | Admit: 2020-06-28 | Discharge: 2020-06-28 | Disposition: A | Discharge status: Home / Self Care | Payer: 59 | Source: Ambulatory Visit | Attending: Family Medicine | Admitting: Family Medicine

## 2020-06-28 VITALS — BP 128/74 | HR 75 | Ht 70.0 in | Wt 219.0 lb

## 2020-06-28 DIAGNOSIS — S6982XA Other specified injuries of left wrist, hand and finger(s), initial encounter: Secondary | ICD-10-CM | POA: Insufficient documentation

## 2020-06-28 DIAGNOSIS — K2980 Duodenitis without bleeding: Secondary | ICD-10-CM

## 2020-06-28 DIAGNOSIS — Z125 Encounter for screening for malignant neoplasm of prostate: Secondary | ICD-10-CM | POA: Diagnosis not present

## 2020-06-28 DIAGNOSIS — E669 Obesity, unspecified: Secondary | ICD-10-CM

## 2020-06-28 DIAGNOSIS — Z0000 Encounter for general adult medical examination without abnormal findings: Secondary | ICD-10-CM

## 2020-06-28 DIAGNOSIS — G40909 Epilepsy, unspecified, not intractable, without status epilepticus: Secondary | ICD-10-CM | POA: Diagnosis not present

## 2020-06-28 DIAGNOSIS — B9681 Helicobacter pylori [H. pylori] as the cause of diseases classified elsewhere: Secondary | ICD-10-CM

## 2020-06-28 DIAGNOSIS — M19032 Primary osteoarthritis, left wrist: Secondary | ICD-10-CM | POA: Diagnosis not present

## 2020-06-28 DIAGNOSIS — K219 Gastro-esophageal reflux disease without esophagitis: Secondary | ICD-10-CM | POA: Diagnosis not present

## 2020-06-28 MED ORDER — CARBAMAZEPINE 200 MG PO TABS: 200.0000 mg | ORAL_TABLET | Freq: Three times a day (TID) | ORAL | 1 refills | Status: DC

## 2020-06-28 NOTE — Progress Notes (Signed)
Subjective:    Patient ID: Vincent Padilla, male    DOB: December 18, 1970, 50 y.o.   MRN: [id number]  Vincent Padilla is a 50 y.o. 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 He has been on Carbamazepine 200mg  TID since 1990s, he last had seizure in 97, and they attempted to take him on lower dose in past. He has opted to continue dosing and last level carbamazepine was low in 2020, prior PCP managing, he opted to not lower dose. Not interested in Neurologist has been >20 years since seen one.  GERD H Pylori Followed by GI Dr Allen Norris  Left Wrist Problem / Pain Reports no known injury. He reports gradual worsening onset about 1 month ago. With pain in outer Left wrist, he did not do anything to injure or trauma Left hand wrist. He is active with left arm/hand because of cerebral palsy on R side.   Health Maintenance:  Updated Colonoscopy 02/26/20, next due in 5 years after polyps, 2027.   Depression screen PHQ 2/9 06/28/2020  Decreased Interest 2  Down, Depressed, Hopeless 0  PHQ - 2 Score 2  Altered sleeping 0  Tired, decreased energy 1  Change in appetite 0  Feeling bad or failure about yourself  0  Trouble concentrating 0  Moving slowly or fidgety/restless 0  Suicidal thoughts 0  PHQ-9 Score 3  Difficult doing work/chores Not difficult at all    Past Medical History:  Diagnosis Date   Arthritis    Present to neck   Chicken pox    COVID-19    Elbow pain 10/05/2017   Frequent headaches    Hyperlipidemia 06/08/2016   Hypertriglyceridemia    Migraine    Seizure disorder ([place]) 05/22/2014   Past Surgical History:  Procedure Laterality Date    BIOPSY N/A 02/26/2020   Procedure: BIOPSY;  Surgeon: Lucilla Lame, MD;  Location: [place];  Service: Endoscopy;  Laterality: N/A;   COLONOSCOPY     COLONOSCOPY WITH PROPOFOL N/A 02/26/2020   Procedure: COLONOSCOPY WITH PROPOFOL;  Surgeon: Lucilla Lame, MD;  Location: [place];  Service: Endoscopy;  Laterality: N/A;  priority 4 COVID + 02-04-20   ESOPHAGOGASTRODUODENOSCOPY (EGD) WITH PROPOFOL N/A 02/26/2020   Procedure: ESOPHAGOGASTRODUODENOSCOPY (EGD) WITH PROPOFOL;  Surgeon: Lucilla Lame, MD;  Location: [place];  Service: Endoscopy;  Laterality: N/A;   POLYPECTOMY N/A 02/26/2020   Procedure: POLYPECTOMY;  Surgeon: Lucilla Lame, MD;  Location: [place];  Service: Endoscopy;  Laterality: N/A;   Social History   Socioeconomic History   Marital status: Married    Spouse name: Not on file   Number of children: Not on file   Years of education: Not on file   Highest education level: Not on file  Occupational History   Not on file  Tobacco Use   Smoking status: Never   Smokeless tobacco: Never  Substance and Sexual Activity   Alcohol use: Yes    Comment: maybe one drink in a month   Drug use: Never   Sexual activity: Not on file  Other Topics Concern   Not on file  Social History Narrative  Married.   Works as Conservation officer, nature.   Completed some college.   Enjoys Associate Professor guns, black smithing, working on cars.   Social Determinants of Health   Financial Resource Strain: Not on file  Food Insecurity: Not on file  Transportation Needs: Not on file  Physical Activity: Not on file  Stress: Not on file  Social Connections: Not on file  Intimate Partner Violence: Not on file   Family History  Problem Relation Age of Onset   Cancer Father    Cancer Brother 49       colon, deceased   Colon cancer Brother    Cancer Paternal Grandfather    Heart attack Cousin    Heart attack Cousin    Current Outpatient Medications on File Prior to  Visit  Medication Sig   omeprazole (PRILOSEC) 20 MG capsule Take 40 mg by mouth daily.   No current facility-administered medications on file prior to visit.    Review of Systems Per HPI unless specifically indicated above      Objective:    BP 128/74   Pulse 75   Ht 5\' 10"  (1.778 m)   Wt 219 lb (99.3 kg)   SpO2 98%   BMI 31.42 kg/m   Wt Readings from Last 3 Encounters:  06/28/20 219 lb (99.3 kg)  02/26/20 223 lb (101.2 kg)  02/23/20 226 lb (102.5 kg)    Physical Exam Vitals and nursing note reviewed.  Constitutional:      General: He is not in acute distress.    Appearance: Normal appearance. He is well-developed. He is not diaphoretic.     Comments: Well-appearing, comfortable, cooperative  HENT:     Head: Normocephalic and atraumatic.  Eyes:     General:        Right eye: No discharge.        Left eye: No discharge.     Conjunctiva/sclera: Conjunctivae normal.  Cardiovascular:     Rate and Rhythm: Normal rate.  Pulmonary:     Effort: Pulmonary effort is normal.  Musculoskeletal:     Comments: Left wrist appears to not have any deformity area of pain is medial ulnar wrist aspect. Some prominence compared to R.  Skin:    General: Skin is warm and dry.     Findings: No erythema or rash.  Neurological:     Mental Status: He is alert and oriented to person, place, and time.  Psychiatric:        Mood and Affect: Mood normal.        Behavior: Behavior normal.        Thought Content: Thought content normal.     Comments: Well groomed, good eye contact, normal speech and thoughts     Results for orders placed or performed during the hospital encounter of 02/26/20  Surgical pathology  Result Value Ref Range   SURGICAL PATHOLOGY      SURGICAL PATHOLOGY CASE: [id number] PATIENT: [facility] Surgical Pathology Report     Specimen Submitted: A. Stomach; cbx B. Colon polyp, ascending; cs C. Colon polyp x2, cecum; cs D. Colon polyp x3, transverse;  cs(2) cbx(1)  Clinical History: Screening colonoscopy family history of colon cancer, normal egd, colon polyps      DIAGNOSIS: A.  STOMACH; COLD BIOPSY: - MODERATE CHRONIC ACTIVE HELICOBACTER-ASSOCIATED GASTRITIS. - NEGATIVE FOR DYSPLASIA AND MALIGNANCY.  B.  COLON POLYP, ASCENDING; COLD SNARE: - TUBULAR ADENOMA. - NEGATIVE FOR HIGH-GRADE DYSPLASIA AND MALIGNANCY.  C.  COLON POLYP X2, CECUM; COLD  SNARE: - TUBULAR ADENOMA (1). - HYPERPLASTIC POLYP (1). - NEGATIVE FOR HIGH-GRADE DYSPLASIA AND MALIGNANCY.  D.  COLON POLYP X3, TRANSVERSE; COLD SNARE (2) AND COLD BIOPSY (1): - TUBULAR ADENOMA (2). - HYPERPLASTIC POLYP (2). - NEGATIVE FOR HIGH-GRADE DYSPLASIA AND MALIGNANCY.   GROSS DESCRIPTION: A. Labeled: Gastric biopsy rule out H. p ylori (per requisition cold biopsy) Received: Formalin Collection time: 8:01 AM on 02/26/2020 Placed into formalin time: 8:01 AM on 02/26/2020 Tissue fragment(s): 2 Size: Range from 0.2-0.4 cm Description: Received are 2 fragments of tan soft tissue.  The smaller fragment may not survive processing. Entirely submitted in 1 cassette.  B. Labeled: Ascending colon polyp (per requisition cold snare) Received: Formalin Collection time: 8:03 AM on 02/26/2020 Placed into formalin time: 8:03 AM on 02/26/2020 Tissue fragment(s): 1 Size: 0.5 x 0.5 x 0.5 cm Description: Received is a fragment of tan-pink polypoid soft tissue. There is a presumed resection margin which is inked blue. The specimen is bisected. Entirely submitted in 1 cassette.  C. Labeled: Cecum colon polyps x2 (per requisition cold snare) Received: Formalin Collection time: 8:09 AM on 02/26/2020 Placed into formalin time: 9:08 AM on 02/26/2020 Tissue fragment(s): Multiple Size: Aggregate, 1.5 x 0.5 x 0.2 cm D escription: Received are 2 elongated fragments of tan soft tissue and 2 fragments of intestinal debris. The ratio of soft tissue to intestinal debris is 90: 10. Entirely  submitted in 1 cassette.  D. Labeled: Transverse colon polyp (per requisition polyps x3-cold snare x2, cold biopsy x1) Received: Formalin Collection time: 8:14 AM on 02/26/2020 Placed into formalin time: 8:14 AM on 02/26/2020 Tissue fragment(s): Multiple Size: Aggregate, 1.1 x 0.4 x 0.2 cm Description: Received are at least 2 fragments of tan soft tissue admixed with intestinal debris. The ratio of soft tissue to intestinal debris is 70: 30. Entirely submitted in 1 cassette.   Final Diagnosis performed by Quay Burow, MD.   Electronically signed 02/27/2020 10:11:11AM The electronic signature indicates that the named Attending Pathologist has evaluated the specimen Technical component performed at [facility], [address], [place], [place] [address] Lab: [phone] Dir: Rush Farmer, MD, MMM  Profes sional component performed at [facility], [facility], [place], [place], [place] [address] Lab: [phone] Dir: Dellia Nims. Rubinas, MD       Assessment & Plan:   Problem List Items Addressed This Visit     TFCC (triangular fibrocartilage complex) injury, left, initial encounter   Relevant Orders   DG Wrist Complete Left   Seizure disorder ([place]) - Primary   Relevant Medications   carbamazepine (TEGRETOL) 200 MG tablet   Gastroesophageal reflux disease   Other Visit Diagnoses     H. pylori duodenitis           L Wrist Pain Suspected TFCC injury L hand dominant with R cerebral palsy deficit Overuse injury by history. No trauma No obvious deformity or other injury Reassurance Trial on Wrist Widget, as advised. X-ray L wrist today Supportive care  #Seizures Chronic problem congenital Last seizure 1997 Controlled on Carbamazepine 200mg  TID, previous by PCP managed. Not seen Neurology in >20 years. Overdue for drug monitoring Carbamazepine lvl. Will check with labs, refill med for now. Agree to maintain current treatment plan but advised may need  Neurologist in future.  #GERD H PYlori Some improved but unresolved, had EGD with dx H pylori per Dr Allen Norris / [facility] Advised him to return to them as planned, it is overdue at this point and discuss next options  for eval and treatment.  Meds ordered this encounter  Medications   carbamazepine (TEGRETOL) 200 MG tablet    Sig: Take 1 tablet (200 mg total) by mouth 3 (three) times daily.    Dispense:  270 tablet    Refill:  1       Follow up plan: Return in about 1 week (around 07/05/2020) for Keep apt in 1 week 6/20 for physical.  Labs ordered for today for next week Physical including Tegretol level.  Nobie Putnam, [facility] 06/28/2020, 10:14 AM

## 2020-06-28 NOTE — Patient Instructions (Addendum)
Thank you for coming to the office today.  For Seizures - I would recommend consultation with a Neurologist  [facility]) [address] [place], [place] [address] Main: [phone]  Call them and schedule follow-up / testing, since it looks like we are overdue for repeat H Pylori testing.  Recommend Wrist Widget Support for TFCC injury  [email or web address]  X-ray of Left Wrist.  Labs today   Please schedule a Follow-up Appointment to: No follow-ups on file.  If you have any other questions or concerns, please feel free to call the office or send a message through [facility]. You may also schedule an earlier appointment if necessary.  Additionally, you may be receiving a survey about your experience at our office within a few days to 1 week by e-mail or mail. We value your feedback.  Nobie Putnam, DO [facility], [facility]  Triangular Fibrocartilage Tear Rehab Ask your health care provider which exercises are safe for you. Do exercises exactly as told by your health care provider and adjust them as directed. It is normal to feel mild stretching, pulling, tightness, or discomfort as you do these exercises. Stop right away if you feel sudden pain or your pain gets worse. Do not begin these exercises until told by your health care provider. Stretching and range-of-motion exercises These exercises warm up your muscles and joints and improve the movement and flexibility of your wrist. These exercises may also help to relieve pain,numbness, and tingling. Wrist flexion, active-assisted  Extend your left / right arm in front of you, and point your fingers downward. If told by your health care provider, bend your left / right elbow. Using your other hand, gently pull the palm of your hand down toward you until you feel a gentle stretch on the top of your forearm. Hold this position for __________ seconds. Slowly return to the  starting position. Repeat __________ times. Complete this exercise __________ times a day. Wrist extension, active-assisted  Extend your left / right arm in front of you and turn your palm upward. If told by your health care provider, bend your left / right elbow. Using your other hand, gently pull your palm and fingertips back so your fingers point downward. You should feel a gentle stretch on the inside of your forearm. Hold this position for __________ seconds. Slowly return to the starting position. Repeat __________ times. Complete this exercise __________ times a day. Forearm supination, active Stand or sit with your arms at your sides. Bend your left / right elbow to a 90-degree angle (right angle). Turn your palm upward (supination) until you feel a gentle stretch in the inside of your forearm. Hold this position for __________ seconds. Slowly return your palm to the starting position. Repeat __________ times. Complete this exercise __________ times a day. Forearm pronation, active Stand or sit with your arms at your sides. Bend your left / right elbow to a 90-degree angle (right angle). Turn your palm downward (pronation) until you feel a gentle stretch in the top of your forearm. Hold this position for __________ seconds. Slowly return your palm to the starting position. Repeat __________ times. Complete this exercise __________ times a day. Wrist extension, standing  Stand over a tabletop with your left / right hand resting palm-down on the tabletop and your fingers pointing away from your body. Your arm should be extended, and there should be a slight bend in your elbow. Gently press your fingers and palm down (extension) onto the  table by straightening your elbow. You should feel a stretch on the inside of your forearm. Hold this position for __________ seconds. Slowly return to the starting position. Repeat __________ times. Complete this exercise __________ times a  day. Wrist flexion, standing  Stand over a tabletop with your left / right hand resting palm-up on the tabletop and your fingers pointing away from your body. Your arm should be extended, and there should be a slight bend in your elbow. Gently press the back of your hand down (flexion) onto the table by straightening your elbow. You should feel a stretch on the top of your forearm. Hold this position for __________ seconds. Slowly return to the starting position. Repeat __________ times. Complete this exercise __________ times a day. Strengthening exercises These exercises build strength and endurance in your wrist. Endurance is theability to use your muscles for a long time, even after they get tired. Wrist flexion, seated Sit with your left / right forearm supported on a table and your hand resting palm-up over the edge of the table. Your elbow should be below the level of your shoulder. Hold a __________ weight in your left / right hand. Or, hold a rubber exercise band or tube in both hands, keeping your hands at the same level and hip distance apart. There should be a slight tension in the exercise band or tube. Slowly curl your hand up toward your forearm (flexion). Hold this position for __________ seconds. Slowly lower your hand to the starting position. Repeat __________ times. Complete this exercise __________ times a day. Wrist extension, seated Sit with your left / right forearm supported on a table and your hand resting palm-down over the edge of the table. Your elbow should be below the level of your shoulder. Hold a __________ weight in your left / right hand. Or, hold a rubber exercise band or tube in both hands, keeping your hands at the same level and hip distance apart. There should be a slight tension in the exercise band or tube. Slowly curl your hand up toward your forearm (extension). Hold this position for __________ seconds. Slowly lower your hand to the starting  position. Repeat __________ times. Complete this exercise __________ times a day. Ulnar deviation Stand with a __________ weight in your left / right hand. Or, sit with your healthy hand supported, and hold on to a rubber exercise band or tube with both hands. There should be a slight tension in the exercise band or tube. Do not move your healthy hand during the exercise. Move your wrist so your pinkie travels toward your forearm and your thumb moves away from your forearm (ulnar deviation). Hold this position for __________ seconds. Slowly return to the starting position. Repeat __________ times. Complete this exercise __________ times a day. Radial deviation Stand with a __________ weight in your left / right hand. Or, sit with your healthy hand supported, and hold on to a rubber exercise band or tube with both hands. There should be a slight tension in the exercise band or tube. Do not move your healthy hand during the exercise. Move your wrist so your thumb travels toward your forearm and your pinkie moves away from your forearm (radial deviation). Hold this position for __________ seconds. Slowly return to the starting position. Repeat __________ times. Complete this exercise __________ times a day. Forearm rotation, supination  Sit with your left / right forearm supported on a table. Your elbow should be below the level of your shoulder. Gently  grip a hammer or a soup ladle with your left / right hand. Rest your hand over the edge of the table, palm down. Without moving your left / right elbow, slowly rotate your palm up (supination), stopping when your thumb is pointed toward the ceiling. Hold this position for __________ seconds. Slowly return to the starting position. Repeat __________ times. Complete this exercise __________ times a day. Forearm rotation, pronation  Sit with your left / right forearm supported on a table. Your elbow should be below the level of your  shoulder. Gently grip a hammer or a soup ladle with your left / right hand. Rest your hand over the edge of the table, palm up. Without moving your left / right elbow, slowly rotate your palm down (pronation), stopping when your thumb is pointed toward the floor. Hold this position for __________ seconds. Slowly return to the starting position. Repeat __________ times. Complete this exercise __________ times a day. Grip  In your left / right hand, hold a tennis ball, a dense sponge, or a large, rolled sock. Slowly increase the pressure, squeezing the object as much as you can without causing pain. Think of bringing the tips of your fingers into the middle of your palm. All of your finger joints should bend when doing this exercise. Hold this position for __________ seconds. Slowly release your grip. Repeat __________ times. Complete this exercise __________ times a day. This information is not intended to replace advice given to you by your health care provider. Make sure you discuss any questions you have with your healthcare provider. Document Revised: 04/23/2018 Document Reviewed: 01/14/2018 Elsevier Patient Education  [place].

## 2020-06-29 LAB — PSA: PSA: 0.3 ng/mL (ref <=4.00)

## 2020-06-29 LAB — CBC WITH DIFFERENTIAL/PLATELET
Absolute Monocytes: 369 cells/uL (ref 200–950)
Basophils Absolute: 52 cells/uL (ref 0–200)
Basophils Relative: 1 %
Eosinophils Absolute: 130 cells/uL (ref 15–500)
Eosinophils Relative: 2.5 %
HCT: 46.8 % (ref 38.5–50.0)
Hemoglobin: 15.5 g/dL (ref 13.2–17.1)
Lymphs Abs: 1789 cells/uL (ref 850–3900)
MCH: 29.8 pg (ref 27.0–33.0)
MCHC: 33.1 g/dL (ref 32.0–36.0)
MCV: 89.8 fL (ref 80.0–100.0)
MPV: 9.9 fL (ref 7.5–12.5)
Monocytes Relative: 7.1 %
Neutro Abs: 2860 cells/uL (ref 1500–7800)
Neutrophils Relative %: 55 %
Platelets: 204 10*3/uL (ref 140–400)
RBC: 5.21 10*6/uL (ref 4.20–5.80)
RDW: 12.8 % (ref 11.0–15.0)
Total Lymphocyte: 34.4 %
WBC: 5.2 10*3/uL (ref 3.8–10.8)

## 2020-06-29 LAB — COMPLETE METABOLIC PANEL WITH GFR
AG Ratio: 1.7 (calc) (ref 1.0–2.5)
ALT: 28 U/L (ref 9–46)
AST: 17 U/L (ref 10–35)
Albumin: 4.3 g/dL (ref 3.6–5.1)
Alkaline phosphatase (APISO): 65 U/L (ref 35–144)
BUN: 17 mg/dL (ref 7–25)
CO2: 26 mmol/L (ref 20–32)
Calcium: 8.9 mg/dL (ref 8.6–10.3)
Chloride: 106 mmol/L (ref 98–110)
Creat: 0.84 mg/dL (ref 0.70–1.33)
GFR, Est African American: 117 mL/min/{1.73_m2} (ref >=60)
GFR, Est Non African American: 101 mL/min/{1.73_m2} (ref >=60)
Globulin: 2.5 g/dL (calc) (ref 1.9–3.7)
Glucose, Bld: 113 mg/dL — ABNORMAL HIGH (ref 65–99)
Potassium: 4 mmol/L (ref 3.5–5.3)
Sodium: 140 mmol/L (ref 135–146)
Total Bilirubin: 0.3 mg/dL (ref 0.2–1.2)
Total Protein: 6.8 g/dL (ref 6.1–8.1)

## 2020-06-29 LAB — HEMOGLOBIN A1C
Hgb A1c MFr Bld: 5.2 % of total Hgb (ref <5.7)
Mean Plasma Glucose: 103 mg/dL
eAG (mmol/L): 5.7 mmol/L

## 2020-06-29 LAB — LIPID PANEL
Cholesterol: 208 mg/dL — ABNORMAL HIGH (ref <200)
HDL: 34 mg/dL — ABNORMAL LOW (ref >=40)
Non-HDL Cholesterol (Calc): 174 mg/dL (calc) — ABNORMAL HIGH (ref <130)
Total CHOL/HDL Ratio: 6.1 (calc) — ABNORMAL HIGH (ref <5.0)
Triglycerides: 481 mg/dL — ABNORMAL HIGH (ref <150)

## 2020-06-29 LAB — CARBAMAZEPINE LEVEL, TOTAL: Carbamazepine Lvl: 6.5 mg/L (ref 4.0–12.0)

## 2020-07-05 ENCOUNTER — Ambulatory Visit (INDEPENDENT_AMBULATORY_CARE_PROVIDER_SITE_OTHER): Payer: 59 | Admitting: Family Medicine

## 2020-07-05 ENCOUNTER — Encounter: Payer: Self-pay | Admitting: Family Medicine

## 2020-07-05 ENCOUNTER — Other Ambulatory Visit: Payer: Self-pay

## 2020-07-05 VITALS — BP 124/80 | HR 83 | Ht 70.0 in | Wt 220.4 lb

## 2020-07-05 DIAGNOSIS — E781 Pure hyperglyceridemia: Secondary | ICD-10-CM

## 2020-07-05 DIAGNOSIS — E669 Obesity, unspecified: Secondary | ICD-10-CM

## 2020-07-05 DIAGNOSIS — G40909 Epilepsy, unspecified, not intractable, without status epilepticus: Secondary | ICD-10-CM | POA: Diagnosis not present

## 2020-07-05 DIAGNOSIS — Z0000 Encounter for general adult medical examination without abnormal findings: Secondary | ICD-10-CM | POA: Diagnosis not present

## 2020-07-05 NOTE — Progress Notes (Signed)
Subjective:    Patient ID: Vincent Padilla, male    DOB: [birth year], 50 y.o.   MRN: [id number]  Vincent Padilla is a 50 y.o. male presenting on 07/05/2020 for Annual Exam   HPI  Here for Annual Physical and Lab Review.  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 He has been on Carbamazepine 200mg  TID since 1990s, he last had seizure in 97, and they attempted to take him on lower dose in past. He has opted to continue dosing and last level carbamazepine was low in 2020, prior PCP managing, he opted to not lower dose. Not interested in Neurologist has been >20 years since seen one.   GERD H Pylori Followed by GI Dr Allen Norris  HYPERLIPIDEMIA: - Reports concerns. Last lipid panel 06/2020, elevated TG 481, other lipids within range Previously on Fish Oil but he forgets often. Diet admits poor diet often. No known fam history but could have HyperTG in fam just not aware   Left Wrist Problem / Pain Reports no known injury. He reports gradual worsening onset about 1 month ago. With pain in outer Left wrist, he did not do anything to injure or trauma Left hand wrist. He is active with left arm/hand because of cerebral palsy on R side.     Health Maintenance:  Shingirx due, he declines now.  Prostate CA Screening 0.30 (06/2020)   Updated Colonoscopy 02/26/20, next due in 5 years after polyps, 2027.  Depression screen [facility] 2/9 07/05/2020 06/28/2020  Decreased Interest 2 2  Down, Depressed, Hopeless 0 0  PHQ - 2 Score 2 2  Altered sleeping 0 0  Tired, decreased energy 0 1  Change in appetite 0 0  Feeling bad or failure about yourself  1 0  Trouble concentrating 0 0  Moving slowly or fidgety/restless 0 0  Suicidal thoughts 0 0  PHQ-9 Score 3 3  Difficult doing  work/chores Not difficult at all Not difficult at all    Past Medical History:  Diagnosis Date   Arthritis    Present to neck   Chicken pox    COVID-19    Elbow pain 10/05/2017   Frequent headaches    Hyperlipidemia 06/08/2016   Hypertriglyceridemia    Migraine    Seizure disorder ([place]) 05/22/2014   Past Surgical History:  Procedure Laterality Date   BIOPSY N/A 02/26/2020   Procedure: BIOPSY;  Surgeon: Lucilla Lame, MD;  Location: [place];  Service: Endoscopy;  Laterality: N/A;   COLONOSCOPY     COLONOSCOPY WITH PROPOFOL N/A 02/26/2020   Procedure: COLONOSCOPY WITH PROPOFOL;  Surgeon: Lucilla Lame, MD;  Location: [place];  Service: Endoscopy;  Laterality: N/A;  priority 4 COVID + 02-04-20   ESOPHAGOGASTRODUODENOSCOPY (EGD) WITH PROPOFOL N/A 02/26/2020   Procedure: ESOPHAGOGASTRODUODENOSCOPY (EGD) WITH PROPOFOL;  Surgeon: Lucilla Lame, MD;  Location: [place];  Service: Endoscopy;  Laterality: N/A;   POLYPECTOMY N/A 02/26/2020   Procedure: POLYPECTOMY;  Surgeon: Lucilla Lame, MD;  Location: [place];  Service: Endoscopy;  Laterality: N/A;   Social History   Socioeconomic History   Marital status: Married    Spouse name: Not on file   Number of children: Not on file   Years of education: Not on file  Highest education level: Not on file  Occupational History   Not on file  Tobacco Use   Smoking status: Never   Smokeless tobacco: Never  Substance and Sexual Activity   Alcohol use: Yes    Comment: maybe one drink in a month   Drug use: Never   Sexual activity: Not on file  Other Topics Concern   Not on file  Social History Narrative   Married.   Works as Conservation officer, nature.   Completed some college.   Enjoys Associate Professor guns, black smithing, working on cars.   Social Determinants of Health   Financial Resource Strain: Not on file  Food Insecurity: Not on file  Transportation Needs: Not on file  Physical Activity: Not on file   Stress: Not on file  Social Connections: Not on file  Intimate Partner Violence: Not on file   Family History  Problem Relation Age of Onset   Cancer Father    Cancer Brother 72       colon, deceased   Colon cancer Brother    Cancer Paternal Grandfather    Heart attack Cousin    Heart attack Cousin    Current Outpatient Medications on File Prior to Visit  Medication Sig   carbamazepine (TEGRETOL) 200 MG tablet Take 1 tablet (200 mg total) by mouth 3 (three) times daily.   Omega-3 Fatty Acids (FISH OIL) 1000 MG CAPS Take 2,000 mg by mouth daily with supper.   omeprazole (PRILOSEC) 20 MG capsule Take 40 mg by mouth daily.   No current facility-administered medications on file prior to visit.    Review of Systems  Constitutional:  Negative for activity change, appetite change, chills, diaphoresis, fatigue and fever.  HENT:  Negative for congestion and hearing loss.   Eyes:  Negative for visual disturbance.  Respiratory:  Negative for cough, chest tightness, shortness of breath and wheezing.   Cardiovascular:  Negative for chest pain, palpitations and leg swelling.  Gastrointestinal:  Negative for abdominal pain, constipation, diarrhea, nausea and vomiting.  Genitourinary:  Negative for dysuria, frequency and hematuria.  Musculoskeletal:  Negative for arthralgias and neck pain.  Skin:  Negative for rash.  Neurological:  Negative for dizziness, weakness, light-headedness, numbness and headaches.  Hematological:  Negative for adenopathy.  Psychiatric/Behavioral:  Negative for behavioral problems, dysphoric mood and sleep disturbance.   Per HPI unless specifically indicated above      Objective:    BP 124/80   Pulse 83   Ht 5\' 10"  (1.778 m)   Wt 220 lb 6.4 oz (100 kg)   SpO2 99%   BMI 31.62 kg/m   Wt Readings from Last 3 Encounters:  07/05/20 220 lb 6.4 oz (100 kg)  06/28/20 219 lb (99.3 kg)  02/26/20 223 lb (101.2 kg)    Physical Exam Vitals and nursing note  reviewed.  Constitutional:      General: He is not in acute distress.    Appearance: He is well-developed. He is not diaphoretic.     Comments: Well-appearing, comfortable, cooperative  HENT:     Head: Normocephalic and atraumatic.  Eyes:     General:        Right eye: No discharge.        Left eye: No discharge.     Conjunctiva/sclera: Conjunctivae normal.     Pupils: Pupils are equal, round, and reactive to light.  Neck:     Thyroid: No thyromegaly.     Vascular: No carotid bruit.  Cardiovascular:  Rate and Rhythm: Normal rate and regular rhythm.     Pulses: Normal pulses.     Heart sounds: Normal heart sounds. No murmur heard. Pulmonary:     Effort: Pulmonary effort is normal. No respiratory distress.     Breath sounds: Normal breath sounds. No wheezing or rales.  Abdominal:     General: Bowel sounds are normal. There is no distension.     Palpations: Abdomen is soft. There is no mass.     Tenderness: There is no abdominal tenderness.  Musculoskeletal:        General: No tenderness. Normal range of motion.     Cervical back: Normal range of motion and neck supple.     Right lower leg: No edema.     Left lower leg: No edema.     Comments: Upper / Lower Extremities: - Normal muscle tone, strength bilateral upper extremities 5/5, lower extremities 5/5  Lymphadenopathy:     Cervical: No cervical adenopathy.  Skin:    General: Skin is warm and dry.     Findings: No erythema or rash.  Neurological:     Mental Status: He is alert and oriented to person, place, and time.     Comments: Distal sensation intact to light touch all extremities  Psychiatric:        Mood and Affect: Mood normal.        Behavior: Behavior normal.        Thought Content: Thought content normal.     Comments: Well groomed, good eye contact, normal speech and thoughts    I have personally reviewed the radiology report from 06/28/20.  CLINICAL DATA:  Ulnar pain   EXAM: LEFT WRIST - COMPLETE 3+  VIEW   COMPARISON:  None.   FINDINGS: Mild positive ulnar variance. No acute bony abnormality. Specifically, no fracture, subluxation, or dislocation. Accessory ossicle noted adjacent the lateral margin of the hamate. Normal bone mineralization. No worrisome lytic or blastic lesions. Soft tissues are unremarkable. Mild triscaphe arthrosis.   IMPRESSION: No acute osseous or soft tissue abnormality.   Slight positive ulnar variance.   Mild triscaphe arthrosis.     Electronically Signed   By: Lovena Le M.D.   On: 06/29/2020 01:26   Results for orders placed or performed in visit on 06/28/20  COMPLETE METABOLIC PANEL WITH GFR  Result Value Ref Range   Glucose, Bld 113 (H) 65 - 99 mg/dL   BUN 17 7 - 25 mg/dL   Creat 0.84 0.70 - 1.33 mg/dL   GFR, Est Non African American 101 > OR = 60 mL/min/1.32m2   GFR, Est African American 117 > OR = 60 mL/min/1.13m2   BUN/Creatinine Ratio NOT APPLICABLE 6 - 22 (calc)   Sodium 140 135 - 146 mmol/L   Potassium 4.0 3.5 - 5.3 mmol/L   Chloride 106 98 - 110 mmol/L   CO2 26 20 - 32 mmol/L   Calcium 8.9 8.6 - 10.3 mg/dL   Total Protein 6.8 6.1 - 8.1 g/dL   Albumin 4.3 3.6 - 5.1 g/dL   Globulin 2.5 1.9 - 3.7 g/dL (calc)   AG Ratio 1.7 1.0 - 2.5 (calc)   Total Bilirubin 0.3 0.2 - 1.2 mg/dL   Alkaline phosphatase (APISO) 65 35 - 144 U/L   AST 17 10 - 35 U/L   ALT 28 9 - 46 U/L  CBC with Differential/Platelet  Result Value Ref Range   WBC 5.2 3.8 - 10.8 Thousand/uL   RBC 5.21 4.20 -  5.80 Million/uL   Hemoglobin 15.5 13.2 - 17.1 g/dL   HCT 46.8 38.5 - 50.0 %   MCV 89.8 80.0 - 100.0 fL   MCH 29.8 27.0 - 33.0 pg   MCHC 33.1 32.0 - 36.0 g/dL   RDW 12.8 11.0 - 15.0 %   Platelets 204 140 - 400 Thousand/uL   MPV 9.9 7.5 - 12.5 fL   Neutro Abs 2,860 1,500 - 7,800 cells/uL   Lymphs Abs 1,789 850 - 3,900 cells/uL   Absolute Monocytes 369 200 - 950 cells/uL   Eosinophils Absolute 130 15 - 500 cells/uL   Basophils Absolute 52 0 - 200 cells/uL    Neutrophils Relative % 55 %   Total Lymphocyte 34.4 %   Monocytes Relative 7.1 %   Eosinophils Relative 2.5 %   Basophils Relative 1.0 %  Lipid panel  Result Value Ref Range   Cholesterol 208 (H) <200 mg/dL   HDL 34 (L) > OR = 40 mg/dL   Triglycerides 481 (H) <150 mg/dL   LDL Cholesterol (Calc)  mg/dL (calc)   Total CHOL/HDL Ratio 6.1 (H) <5.0 (calc)   Non-HDL Cholesterol (Calc) 174 (H) <130 mg/dL (calc)  Hemoglobin A1c  Result Value Ref Range   Hgb A1c MFr Bld 5.2 <5.7 % of total Hgb   Mean Plasma Glucose 103 mg/dL   eAG (mmol/L) 5.7 mmol/L  PSA  Result Value Ref Range   PSA 0.30 < OR = 4.00 ng/mL  Carbamazepine Level (Tegretol), total  Result Value Ref Range   Carbamazepine Lvl 6.5 4.0 - 12.0 mg/L      Assessment & Plan:   Problem List Items Addressed This Visit     Seizure disorder ([place])   Obesity (BMI 30.0-34.9)   Hypertriglyceridemia   Other Visit Diagnoses     Annual physical exam    -  Primary       Updated Health Maintenance information Reviewed recent lab results with patient Encouraged improvement to lifestyle with diet and exercise Goal of weight loss  Seizure disorder Controlled on Carbamazepine, lab lvl in therapeutic range.  HyperTG >400 Start fish oil as advised Offer rx fibrate / statin Lifestyle, repeat lab 6 month   No orders of the defined types were placed in this encounter.    Follow up plan: Return in about 6 months (around 01/04/2021) for 6 month fasting lab only then 1 week Follow-up Cholesterol, Seizures, Wrist.  Future lipid panel only in 6 months  Nobie Putnam, DO [facility] 07/05/2020, 2:16 PM

## 2020-07-05 NOTE — Patient Instructions (Addendum)
Thank you for coming to the office today.  Elevated Triglyerides 481, otherwise normal.  Omega-3 -  1,000 mg capsule take 2 per dose once a day with meal  Keep limiting starches and sugars / carbs as well.  X-ray reviewed, keep up with wrist widget and may try stronger wrist brace / splint in future.  DUE for FASTING BLOOD WORK (no food or drink after midnight before the lab appointment, only water or coffee without cream/sugar on the morning of)  SCHEDULE "Lab Only" visit in the morning at the clinic for lab draw in 6 MONTHS   - Make sure Lab Only appointment is at about 1 week before your next appointment, so that results will be available  For Lab Results, once available within 2-3 days of blood draw, you can can log in to MyChart online to view your results and a brief explanation. Also, we can discuss results at next follow-up visit.   Please schedule a Follow-up Appointment to: Return in about 6 months (around 01/04/2021) for 6 month fasting lab only then 1 week Follow-up Cholesterol, Seizures, Wrist.  If you have any other questions or concerns, please feel free to call the office or send a message through [place]. You may also schedule an earlier appointment if necessary.  Additionally, you may be receiving a survey about your experience at our office within a few days to 1 week by e-mail or mail. We value your feedback.  Nobie Putnam, DO [place]

## 2020-07-06 ENCOUNTER — Other Ambulatory Visit: Payer: Self-pay | Admitting: Family Medicine

## 2020-07-06 DIAGNOSIS — E781 Pure hyperglyceridemia: Secondary | ICD-10-CM

## 2020-07-26 ENCOUNTER — Encounter: Payer: Self-pay | Admitting: Family Medicine

## 2020-09-27 ENCOUNTER — Other Ambulatory Visit: Payer: Self-pay

## 2020-09-27 ENCOUNTER — Telehealth: Payer: Self-pay | Admitting: Gastroenterology

## 2020-09-27 NOTE — Telephone Encounter (Signed)
Pt. Wife requesting a call back she said her husband is having complications from his last procedure. She said he should have been on his medicine longer than 10 days

## 2020-09-27 NOTE — Telephone Encounter (Signed)
Pt has been scheduled for an office appt on 10/04/20.

## 2020-10-04 ENCOUNTER — Other Ambulatory Visit: Payer: Self-pay

## 2020-10-04 ENCOUNTER — Encounter: Payer: Self-pay | Admitting: Gastroenterology

## 2020-10-04 ENCOUNTER — Ambulatory Visit (INDEPENDENT_AMBULATORY_CARE_PROVIDER_SITE_OTHER): Payer: 59 | Admitting: Gastroenterology

## 2020-10-04 VITALS — BP 137/68 | HR 62 | Temp 98.2°F | Ht 70.0 in | Wt 224.0 lb

## 2020-10-04 DIAGNOSIS — Z8619 Personal history of other infectious and parasitic diseases: Secondary | ICD-10-CM

## 2020-10-04 DIAGNOSIS — Z7689 Persons encountering health services in other specified circumstances: Secondary | ICD-10-CM | POA: Diagnosis not present

## 2020-10-04 MED ORDER — PANTOPRAZOLE SODIUM 40 MG PO TBEC
40.0000 mg | DELAYED_RELEASE_TABLET | Freq: Every day | ORAL | 3 refills | Status: DC
Start: 2020-10-04 — End: 2021-02-16

## 2020-10-04 NOTE — Progress Notes (Signed)
Primary Care Physician: Vincent Hauser, DO  Primary Gastroenterologist:  Dr. Lucilla Padilla  Chief Complaint  Patient presents with   Follow-up    HPI: Vincent Padilla is a 50 y.o. male here with a history of having abdominal discomfort.  The patient underwent an EGD and colonoscopy and was found to have multiple adenomas and multiple hyperplastic polyps.  The patient also was found to have H. pylori and was treated with antibiotics for 10 days with a recommendation to have a repeat testing 6 weeks after treatment to document complete eradication of the bacteria.  It does not appear that the patient has had that done.  There was a message from the patient's wife to the primary care provider's office reporting a complication of the procedure from 9 months ago that was reported to be that the patient should have been treated for longer than 10 days of antibiotics for his H. pylori.  The patient reports that he continues to have intermittent chest pain that he believes to be heartburn despite taking his Zegerid twice a day.  He also reports that he has had a cardiac workup that has been negative.  The patient is inquiring about any special diet for reflux that he may benefit from.  Past Medical History:  Diagnosis Date   Arthritis    Present to neck   Chicken pox    COVID-19    Elbow pain 10/05/2017   Frequent headaches    Hyperlipidemia 06/08/2016   Hypertriglyceridemia    Migraine    Seizure disorder ([place]) 05/22/2014    Current Outpatient Medications  Medication Sig Dispense Refill   carbamazepine (TEGRETOL) 200 MG tablet Take 1 tablet (200 mg total) by mouth 3 (three) times daily. 270 tablet 1   Omega-3 Fatty Acids (FISH OIL) 1000 MG CAPS Take 2,000 mg by mouth daily with supper.     pantoprazole (PROTONIX) 40 MG tablet Take 1 tablet (40 mg total) by mouth daily. 30 tablet 3   No current facility-administered medications for this visit.    Allergies as of 10/04/2020    (No Known Allergies)    ROS:  General: Negative for anorexia, weight loss, fever, chills, fatigue, weakness. ENT: Negative for hoarseness, difficulty swallowing , nasal congestion. CV: Negative for chest pain, angina, palpitations, dyspnea on exertion, peripheral edema.  Respiratory: Negative for dyspnea at rest, dyspnea on exertion, cough, sputum, wheezing.  GI: See history of present illness. GU:  Negative for dysuria, hematuria, urinary incontinence, urinary frequency, nocturnal urination.  Endo: Negative for unusual weight change.    Physical Examination:   BP 137/68 (BP Location: Left Arm, Patient Position: Sitting, Cuff Size: Large)   Pulse 62   Temp 98.2 F (36.8 C) (Temporal)   Ht 5\' 10"  (1.778 m)   Wt 224 lb (101.6 kg)   BMI 32.14 kg/m   General: Well-nourished, well-developed in no acute distress.  Eyes: No icterus. Conjunctivae pink. Neuro: Alert and oriented x 3.  Grossly intact. Skin: Warm and dry, no jaundice.   Psych: Alert and cooperative, normal mood and affect.  Labs:    Imaging Studies: No results found.  Assessment and Plan:   Vincent Padilla is a 50 y.o. y/o male who comes in today for follow-up of reflux with intermittent atypical chest pain. The patient will switch his Zegerid for Protonix once a day and see if his symptoms help.  The patient has also not been checked for his H. Pylori eradication and will  be set up for a breath test to have his H. Pylori tested.  He will also but me know if the Protonix does not work it once a day and then we will increase it to twice a day.  If he continues to have symptoms that his symptoms may not be reflux related and he may need pH studying to see if he is having esophageal acid exposure.  The patient has been explained the plan and agrees with it.     Vincent Lame, MD. [facility]    Note: This dictation was prepared with Dragon dictation along with smaller phrase technology. Any transcriptional errors that result  from this process are unintentional.

## 2020-10-05 LAB — H. PYLORI BREATH TEST: H pylori Breath Test: NEGATIVE

## 2020-10-06 ENCOUNTER — Telehealth: Payer: Self-pay

## 2020-10-06 NOTE — Telephone Encounter (Signed)
-----   Message from Lucilla Lame, MD sent at 10/05/2020  9:38 PM EDT ----- Please let the patient know that his previous treatment for his H. Pylori was successful and his H. Pylori test is now negative.

## 2020-10-06 NOTE — Telephone Encounter (Signed)
Pt notified of H pylori breath test results.

## 2021-02-16 ENCOUNTER — Other Ambulatory Visit: Payer: Self-pay | Admitting: Gastroenterology

## 2021-05-27 ENCOUNTER — Other Ambulatory Visit: Payer: Self-pay | Admitting: Family Medicine

## 2021-05-27 DIAGNOSIS — K219 Gastro-esophageal reflux disease without esophagitis: Secondary | ICD-10-CM

## 2021-05-27 NOTE — Telephone Encounter (Signed)
Copied from [place] [id number]. Topic: General - Other ?>> May 27, 2021 10:50 AM Tessa Lerner A wrote: ?Reason for CRM: Medication Refill - Medication: pantoprazole (PROTONIX) 40 MG tablet [[id number]]  ? ?Has the patient contacted their pharmacy? Yes.  The patient's wife was directed to contact the patient's PCP. The patient has been unable to reach their GI specialist and will be traveling.  ?(Agent: If no, request that the patient contact the pharmacy for the refill. If patient does not wish to contact the pharmacy document the reason why and proceed with request.) ?(Agent: If yes, when and what did the pharmacy advise?) ? ?Preferred Pharmacy (with phone number or street name): [place], [place] [facility] [address] ?[place] [address] ?Phone: [phone] Fax: [phone] ?Hours: Not open 24 hours ? ? ?Has the patient been seen for an appointment in the last year OR does the patient have an upcoming appointment? Yes.   ? ?Agent: Please be advised that RX refills may take up to 3 business days. We ask that you follow-up with your pharmacy. ?

## 2021-05-30 MED ORDER — PANTOPRAZOLE SODIUM 40 MG PO TBEC: 40.0000 mg | DELAYED_RELEASE_TABLET | Freq: Every day | ORAL | 1 refills | Status: DC

## 2021-05-30 NOTE — Telephone Encounter (Signed)
Requested medication (s) are due for refill today:   Yes ? ?Requested medication (s) are on the active medication list:   Yes ? ?Future visit scheduled:   Yes in 1 wk ? ? ?Last ordered: 02/16/2021 #30, 3 refills ? ?Returned because prescribed by Dr. Lucilla Lame with GI however pt said he hasn't been able to contact GI dr and will be traveling.   Wanting to know if Dr. [place] Ranger would prescribe it.    ? ?Requested Prescriptions  ?Pending Prescriptions Disp Refills  ? pantoprazole (PROTONIX) 40 MG tablet 30 tablet 3  ?  Sig: Take 1 tablet (40 mg total) by mouth daily.  ?  ? Gastroenterology: Proton Pump Inhibitors Passed - 05/27/2021  3:41 PM  ?  ?  Passed - Valid encounter within last 12 months  ?  Recent Outpatient Visits   ? ?      ? 10 months ago Annual physical exam  ? [place], DO  ? 11 months ago Seizure disorder [facility])  ? [place], DO  ? ?  ?  ?Future Appointments   ? ?        ? In 1 week [place] [facility] Devonne Doughty, DO [facility] [place]  ? ?  ? ? ?  ?  ?  ? ?

## 2021-06-08 ENCOUNTER — Encounter: Payer: Self-pay | Admitting: Family Medicine

## 2021-06-08 ENCOUNTER — Ambulatory Visit (INDEPENDENT_AMBULATORY_CARE_PROVIDER_SITE_OTHER): Payer: BC Managed Care – PPO | Admitting: Family Medicine

## 2021-06-08 VITALS — BP 126/80 | HR 58 | Ht 70.0 in | Wt 242.4 lb

## 2021-06-08 DIAGNOSIS — Z23 Encounter for immunization: Secondary | ICD-10-CM

## 2021-06-08 DIAGNOSIS — E781 Pure hyperglyceridemia: Secondary | ICD-10-CM

## 2021-06-08 DIAGNOSIS — K219 Gastro-esophageal reflux disease without esophagitis: Secondary | ICD-10-CM | POA: Diagnosis not present

## 2021-06-08 DIAGNOSIS — E669 Obesity, unspecified: Secondary | ICD-10-CM

## 2021-06-08 DIAGNOSIS — Z125 Encounter for screening for malignant neoplasm of prostate: Secondary | ICD-10-CM

## 2021-06-08 DIAGNOSIS — Z0000 Encounter for general adult medical examination without abnormal findings: Secondary | ICD-10-CM

## 2021-06-08 DIAGNOSIS — G40909 Epilepsy, unspecified, not intractable, without status epilepticus: Secondary | ICD-10-CM

## 2021-06-08 DIAGNOSIS — Z131 Encounter for screening for diabetes mellitus: Secondary | ICD-10-CM

## 2021-06-08 MED ORDER — PANTOPRAZOLE SODIUM 40 MG PO TBEC: 40.0000 mg | DELAYED_RELEASE_TABLET | Freq: Every day | ORAL | 3 refills | Status: DC

## 2021-06-08 MED ORDER — CARBAMAZEPINE 200 MG PO TABS: 200.0000 mg | ORAL_TABLET | Freq: Three times a day (TID) | ORAL | 3 refills | Status: DC

## 2021-06-08 NOTE — Progress Notes (Signed)
Subjective:    Patient ID: Vincent Padilla, male    DOB: [birth year], 51 y.o.   MRN: [id number]  Vincent Padilla is a 52 y.o. male presenting on 06/08/2021 for Annual Exam   HPI  Here for Annual Physical and Lab Review.   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 He has been on Carbamazepine '200mg'$  TID since 1990s, he last had seizure in 97, and they attempted to take him on lower dose in past. He has opted to continue dosing and last level carbamazepine was low in 2020, prior PCP managing, he opted to not lower dose. Not interested in Neurologist has been >20 years since seen one. He needs refills authorized today. No breakthrough seizures now.   GERD H Pylori Followed by GI Dr Bernette Redbird on Pantoprazole '40mg'$  doing well, ran out recently had worse symptoms, improved back on med.   HYPERLIPIDEMIA: - Reports concerns. Last lipid panel 06/2020, elevated TG 481, other lipids within range Previously on Fish Oil but he forgets often. Diet admits poor diet often. No known fam history but could have HyperTG in fam just not aware  BMI >34  His goal is to keep working on improving diet, reduce portions He is working with Physiological scientist for exercise now [employer] gain     Health Maintenance:   Shingirx due, he declines now.   Prostate CA Screening 0.30 (06/2020), due for PSA.   Updated Colonoscopy 02/26/20, next due in 5 years after polyps, 2027.  Tdap today     06/08/2021    1:44 PM 07/05/2020    2:06 PM 06/28/2020    9:57 AM  Depression screen PHQ 2/9  Decreased Interest '2 2 2  '$ Down, Depressed, Hopeless 0 0 0  PHQ - 2 Score '2 2 2  '$ Altered sleeping 0 0 0  Tired, decreased energy 0 0 1  Change in appetite 0 0 0  Feeling bad or failure about  yourself  1 1 0  Trouble concentrating 0 0 0  Moving slowly or fidgety/restless 0 0 0  Suicidal thoughts 0 0 0  PHQ-9 Score '3 3 3  '$ Difficult doing work/chores Not difficult at all Not difficult at all Not difficult at all    Past Medical History:  Diagnosis Date  . Arthritis    Present to neck  . Chicken pox   . COVID-19   . Elbow pain 10/05/2017  . Frequent headaches   . Hyperlipidemia 06/08/2016  . Hypertriglyceridemia   . Migraine   . Seizure disorder ([place]) 05/22/2014   Past Surgical History:  Procedure Laterality Date  . BIOPSY N/A 02/26/2020   Procedure: BIOPSY;  Surgeon: Lucilla Lame, MD;  Location: [facility];  Service: Endoscopy;  Laterality: N/A;  . COLONOSCOPY    . COLONOSCOPY WITH PROPOFOL N/A 02/26/2020   Procedure: COLONOSCOPY WITH PROPOFOL;  Surgeon: Lucilla Lame, MD;  Location: [facility];  Service: Endoscopy;  Laterality: N/A;  priority 4 COVID + 02-04-20  . ESOPHAGOGASTRODUODENOSCOPY (EGD) WITH PROPOFOL N/A 02/26/2020   Procedure: ESOPHAGOGASTRODUODENOSCOPY (EGD) WITH PROPOFOL;  Surgeon: Lucilla Lame, MD;  Location: [facility];  Service: Endoscopy;  Laterality: N/A;  . POLYPECTOMY N/A 02/26/2020   Procedure: POLYPECTOMY;  Surgeon: Lucilla Lame, MD;  Location: [facility];  Service: Endoscopy;  Laterality: N/A;   Social History   Socioeconomic History  . Marital status: Married    Spouse name: Not on file  . Number of children: Not on file  . Years of education: Not on file  . Highest education level: Not on file  Occupational History  . Not on file  Tobacco Use  . Smoking status: Never  . Smokeless tobacco: Never  Substance and Sexual Activity  . Alcohol use: Yes    Comment: maybe one drink in a month  . Drug use: Never  . Sexual activity: Not on file  Other Topics Concern  . Not on file  Social History Narrative   Married.   Works as Conservation officer, nature.   Completed some college.   Enjoys Associate Professor guns, black smithing,  working on cars.   Social Determinants of Health   Financial Resource Strain: Not on file  Food Insecurity: Not on file  Transportation Needs: Not on file  Physical Activity: Not on file  Stress: Not on file  Social Connections: Not on file  Intimate Partner Violence: Not on file   Family History  Problem Relation Age of Onset  . Cancer Father   . Cancer Brother 67       colon, deceased  . Colon cancer Brother   . Cancer Paternal Grandfather   . Heart attack Cousin   . Heart attack Cousin    Current Outpatient Medications on File Prior to Visit  Medication Sig  . Omega-3 Fatty Acids (FISH OIL) 1000 MG CAPS Take 2,000 mg by mouth daily with supper.   No current facility-administered medications on file prior to visit.    Review of Systems  Constitutional:  Negative for activity change, appetite change, chills, diaphoresis, fatigue and fever.  HENT:  Negative for congestion and hearing loss.   Eyes:  Negative for visual disturbance.  Respiratory:  Negative for cough, chest tightness, shortness of breath and wheezing.   Cardiovascular:  Negative for chest pain, palpitations and leg swelling.  Gastrointestinal:  Negative for abdominal pain, constipation, diarrhea, nausea and vomiting.  Genitourinary:  Negative for dysuria, frequency and hematuria.  Musculoskeletal:  Negative for arthralgias and neck pain.  Skin:  Negative for rash.  Neurological:  Negative for dizziness, weakness, light-headedness, numbness and headaches.  Hematological:  Negative for adenopathy.  Psychiatric/Behavioral:  Negative for behavioral problems, dysphoric mood and sleep disturbance.   Per HPI unless specifically indicated above     Objective:    BP 126/80   Pulse (!) 58   Ht '5\' 10"'$  (1.778 m)   Wt 242 lb 6.4 oz (110 kg)   SpO2 98%   BMI 34.78 kg/m   Wt Readings from Last 3 Encounters:  06/08/21 242 lb 6.4 oz (110 kg)  10/04/20 224 lb (101.6 kg)  07/05/20 220 lb 6.4 oz (100 kg)     Physical Exam Vitals and nursing note reviewed.  Constitutional:      General: He is not in acute distress.    Appearance: He is well-developed. He is not diaphoretic.     Comments: Well-appearing, comfortable, cooperative  HENT:     Head: Normocephalic and atraumatic.  Eyes:     General:        Right eye: No discharge.        Left eye: No discharge.     Conjunctiva/sclera: Conjunctivae normal.     Pupils: Pupils are equal, round, and reactive to light.  Neck:  Thyroid: No thyromegaly.     Vascular: No carotid bruit.  Cardiovascular:     Rate and Rhythm: Normal rate and regular rhythm.     Pulses: Normal pulses.     Heart sounds: Normal heart sounds. No murmur heard. Pulmonary:     Effort: Pulmonary effort is normal. No respiratory distress.     Breath sounds: Normal breath sounds. No wheezing or rales.  Abdominal:     General: Bowel sounds are normal. There is no distension.     Palpations: Abdomen is soft. There is no mass.     Tenderness: There is no abdominal tenderness.  Musculoskeletal:        General: No tenderness. Normal range of motion.     Cervical back: Normal range of motion and neck supple.     Right lower leg: No edema.     Left lower leg: No edema.     Comments: Upper / Lower Extremities: - Normal muscle tone, strength bilateral upper extremities 5/5, lower extremities 5/5  Lymphadenopathy:     Cervical: No cervical adenopathy.  Skin:    General: Skin is warm and dry.     Findings: No erythema or rash.  Neurological:     Mental Status: He is alert and oriented to person, place, and time.     Comments: Distal sensation intact to light touch all extremities  Psychiatric:        Mood and Affect: Mood normal.        Behavior: Behavior normal.        Thought Content: Thought content normal.     Comments: Well groomed, good eye contact, normal speech and thoughts    Results for orders placed or performed in visit on 10/04/20  H. pylori breath test   Result Value Ref Range   H pylori Breath Test Negative Negative      Assessment & Plan:   Problem List Items Addressed This Visit     Seizure disorder ([place])    Chronic seizure disorder No breakthrough seizures >20 years Currently stable on dose Carbamazepine Check lab level with lab work       Relevant Medications   carbamazepine (TEGRETOL) 200 MG tablet   Other Relevant Orders   COMPLETE METABOLIC PANEL WITH GFR   CBC with Differential/Platelet   Carbamazepine level, total   Obesity (BMI 30.0-34.9)   Hypertriglyceridemia   Relevant Orders   COMPLETE METABOLIC PANEL WITH GFR   Lipid panel   Gastroesophageal reflux disease   Relevant Medications   pantoprazole (PROTONIX) 40 MG tablet   Other Visit Diagnoses     Annual physical exam    -  Primary   Relevant Orders   COMPLETE METABOLIC PANEL WITH GFR   CBC with Differential/Platelet   Lipid panel   Hemoglobin A1c   Screening for prostate cancer       Relevant Orders   PSA   Screening for diabetes mellitus (DM)       Relevant Orders   Hemoglobin A1c   Need for diphtheria-tetanus-pertussis (Tdap) vaccine       Relevant Orders   Tdap vaccine greater than or equal to 7yo IM (Completed)       Updated Health Maintenance information Fasting labs ordered today. Carbamazepine level ordered. Encouraged improvement to lifestyle with diet and exercise Goal of weight loss  Added refills for 1 year.  Tdap vaccine today good for 10 years.  Labs today stay tuned for results.  Consider Shingrix shingles vaccine. In  future.  Colonoscopy done in 2022, Dr Allen Norris in [place], repeat in 5 years, 2027.   Meds ordered this encounter  Medications  . carbamazepine (TEGRETOL) 200 MG tablet    Sig: Take 1 tablet (200 mg total) by mouth 3 (three) times daily.    Dispense:  270 tablet    Refill:  3    Add extra refills for 1 year supply.  . pantoprazole (PROTONIX) 40 MG tablet    Sig: Take 1 tablet (40 mg total) by mouth  daily.    Dispense:  90 tablet    Refill:  3    Add extra refills for 1 year supply.   Orders Placed This Encounter  Procedures  . Tdap vaccine greater than or equal to 7yo IM  . COMPLETE METABOLIC PANEL WITH GFR  . CBC with Differential/Platelet  . Lipid panel    Order Specific Question:   Has the patient fasted?    Answer:   Yes  . Hemoglobin A1c  . PSA  . Carbamazepine level, total      Follow up plan: Return in about 1 year (around 06/09/2022) for 1 year Annual Physical AM apt fasting lab AFTER.  Nobie Putnam, [facility] 06/08/2021, 8:56 AM

## 2021-06-08 NOTE — Assessment & Plan Note (Signed)
Chronic seizure disorder No breakthrough seizures >20 years Currently stable on dose Carbamazepine Check lab level with lab work

## 2021-06-08 NOTE — Patient Instructions (Addendum)
Thank you for coming to the office today.  Added refills for 1 year.  Tdap vaccine today good for 10 years.  Labs today stay tuned for results.  Consider Shingrix shingles vaccine. In future.  Colonoscopy done in 2022, Dr Allen Norris in [place], repeat in 5 years, 2027.  Next year need FASTING BLOOD WORK (no food or drink after midnight before the lab appointment, only water or coffee without cream/sugar on the morning of)   Please schedule a Follow-up Appointment to: Return in about 1 year (around 06/09/2022) for 1 year Annual Physical AM apt fasting lab AFTER.  If you have any other questions or concerns, please feel free to call the office or send a message through [facility]. You may also schedule an earlier appointment if necessary.  Additionally, you may be receiving a survey about your experience at our office within a few days to 1 week by e-mail or mail. We value your feedback.  Nobie Putnam, DO [place]

## 2021-06-09 LAB — CBC WITH DIFFERENTIAL/PLATELET
Absolute Monocytes: 489 cells/uL (ref 200–950)
Basophils Absolute: 62 cells/uL (ref 0–200)
Basophils Relative: 1.2 %
Eosinophils Absolute: 140 cells/uL (ref 15–500)
Eosinophils Relative: 2.7 %
HCT: 44.3 % (ref 38.5–50.0)
Hemoglobin: 14.9 g/dL (ref 13.2–17.1)
Lymphs Abs: 1945 cells/uL (ref 850–3900)
MCH: 29.9 pg (ref 27.0–33.0)
MCHC: 33.6 g/dL (ref 32.0–36.0)
MCV: 89 fL (ref 80.0–100.0)
MPV: 10.5 fL (ref 7.5–12.5)
Monocytes Relative: 9.4 %
Neutro Abs: 2564 cells/uL (ref 1500–7800)
Neutrophils Relative %: 49.3 %
Platelets: 192 10*3/uL (ref 140–400)
RBC: 4.98 10*6/uL (ref 4.20–5.80)
RDW: 13.1 % (ref 11.0–15.0)
Total Lymphocyte: 37.4 %
WBC: 5.2 10*3/uL (ref 3.8–10.8)

## 2021-06-09 LAB — LIPID PANEL
Cholesterol: 229 mg/dL — ABNORMAL HIGH (ref <200)
HDL: 29 mg/dL — ABNORMAL LOW (ref >=40)
Non-HDL Cholesterol (Calc): 200 mg/dL (calc) — ABNORMAL HIGH (ref <130)
Total CHOL/HDL Ratio: 7.9 (calc) — ABNORMAL HIGH (ref <5.0)
Triglycerides: 725 mg/dL — ABNORMAL HIGH (ref <150)

## 2021-06-09 LAB — COMPLETE METABOLIC PANEL WITH GFR
AG Ratio: 2 (calc) (ref 1.0–2.5)
ALT: 32 U/L (ref 9–46)
AST: 30 U/L (ref 10–35)
Albumin: 4.4 g/dL (ref 3.6–5.1)
Alkaline phosphatase (APISO): 57 U/L (ref 35–144)
BUN: 13 mg/dL (ref 7–25)
CO2: 24 mmol/L (ref 20–32)
Calcium: 8.6 mg/dL (ref 8.6–10.3)
Chloride: 106 mmol/L (ref 98–110)
Creat: 0.93 mg/dL (ref 0.70–1.30)
Globulin: 2.2 g/dL (calc) (ref 1.9–3.7)
Glucose, Bld: 101 mg/dL (ref 65–139)
Potassium: 3.9 mmol/L (ref 3.5–5.3)
Sodium: 140 mmol/L (ref 135–146)
Total Bilirubin: 0.4 mg/dL (ref 0.2–1.2)
Total Protein: 6.6 g/dL (ref 6.1–8.1)
eGFR: 99 mL/min/{1.73_m2} (ref >=60)

## 2021-06-09 LAB — HEMOGLOBIN A1C
Hgb A1c MFr Bld: 5.2 % of total Hgb (ref <5.7)
Mean Plasma Glucose: 103 mg/dL
eAG (mmol/L): 5.7 mmol/L

## 2021-06-09 LAB — PSA: PSA: 0.35 ng/mL (ref <=4.00)

## 2021-06-09 LAB — CARBAMAZEPINE LEVEL, TOTAL: Carbamazepine Lvl: 4.2 mg/L (ref 4.0–12.0)

## 2021-11-27 IMAGING — DX DG WRIST COMPLETE 3+V*L*
4 series · 4 of 4 positions shown · non-contrast
Comparison: None.

CLINICAL DATA: Ulnar pain

EXAM:
LEFT WRIST - COMPLETE 3+ VIEW

[wrist ap]
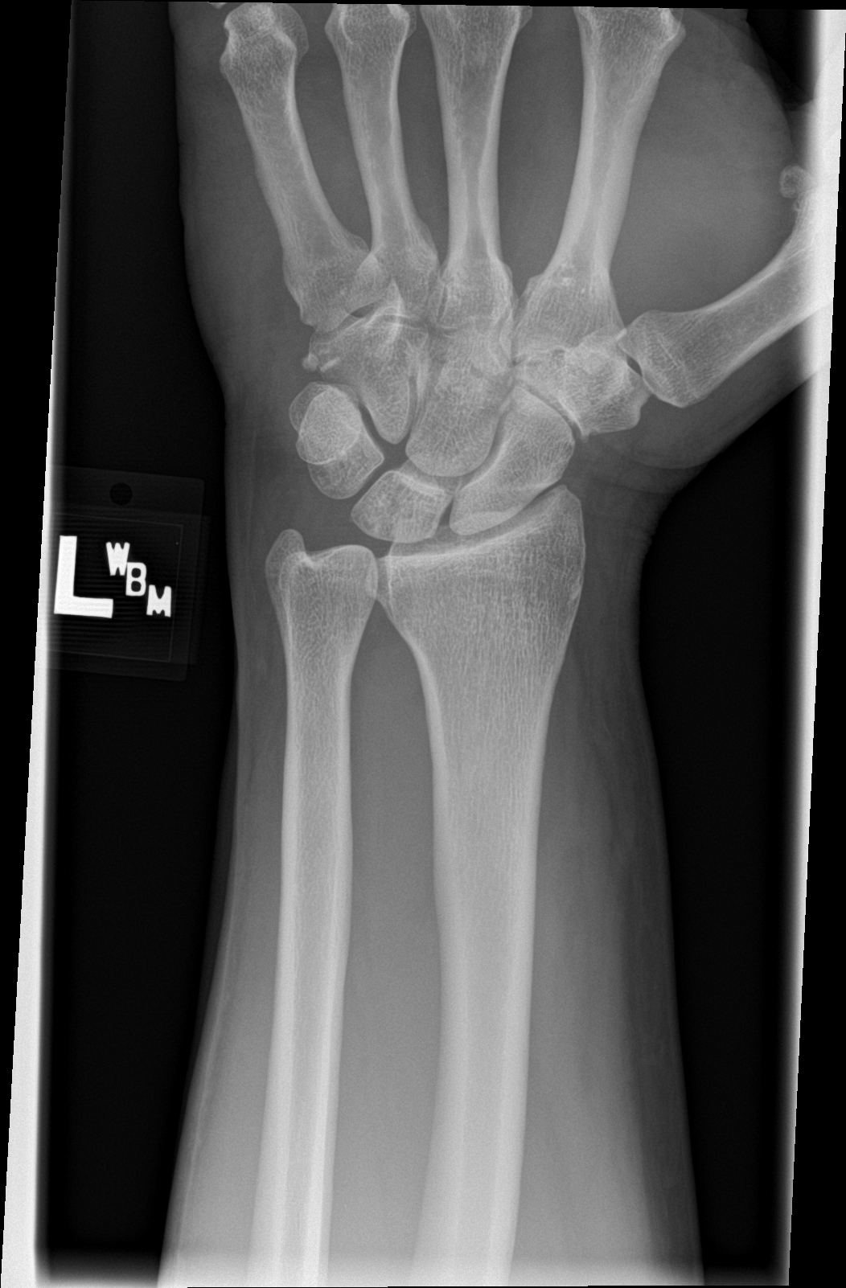

[wrist obl]
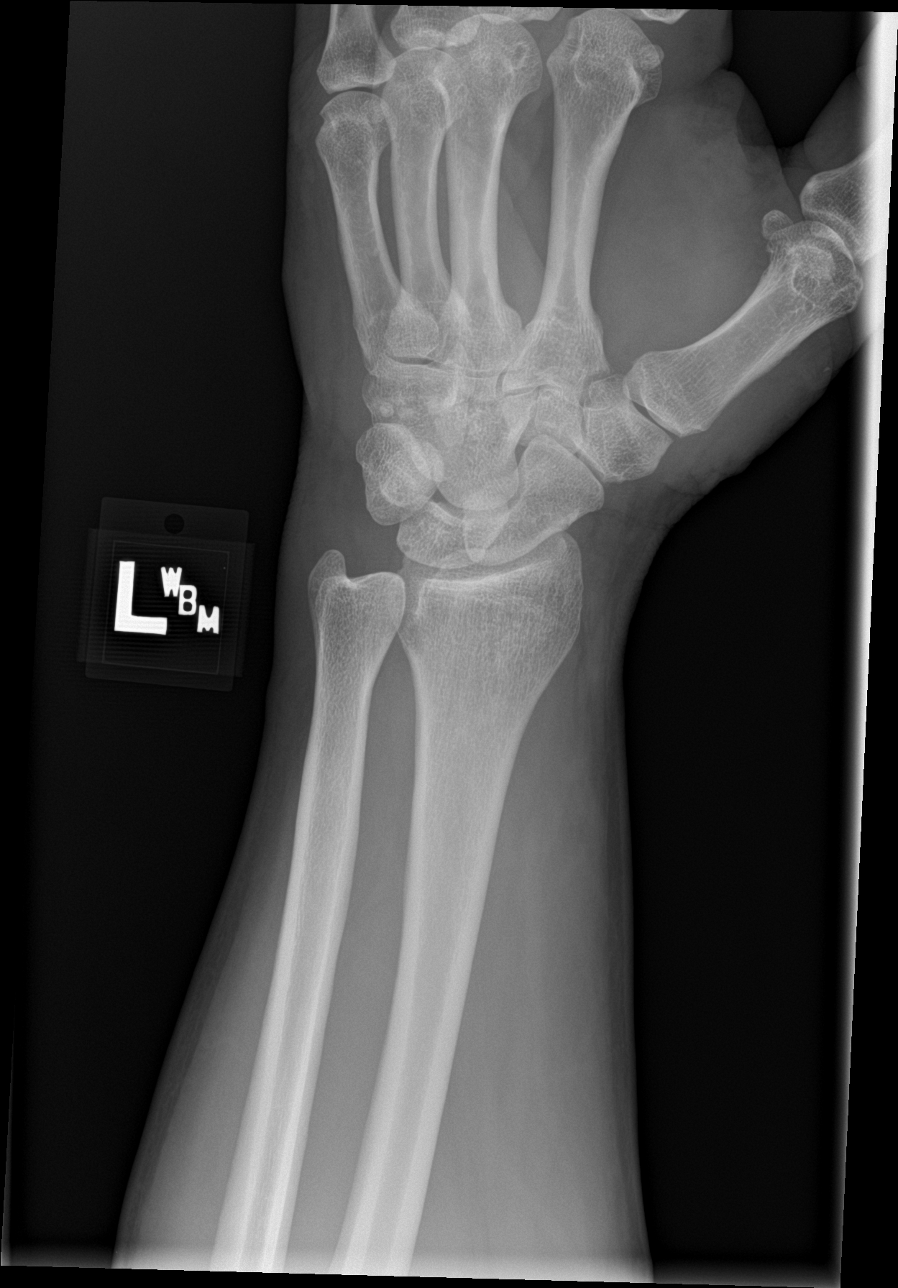

[wrist lat]
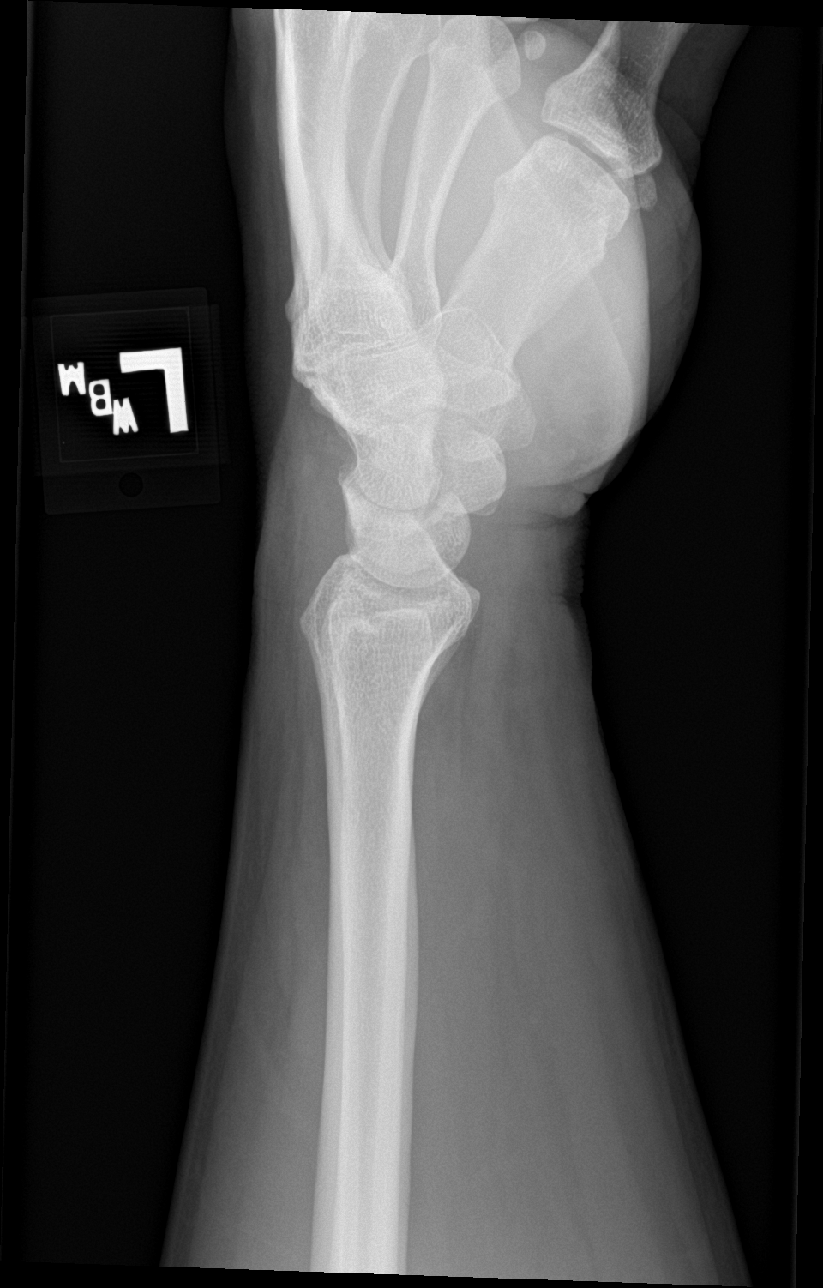

[wrist navicular]
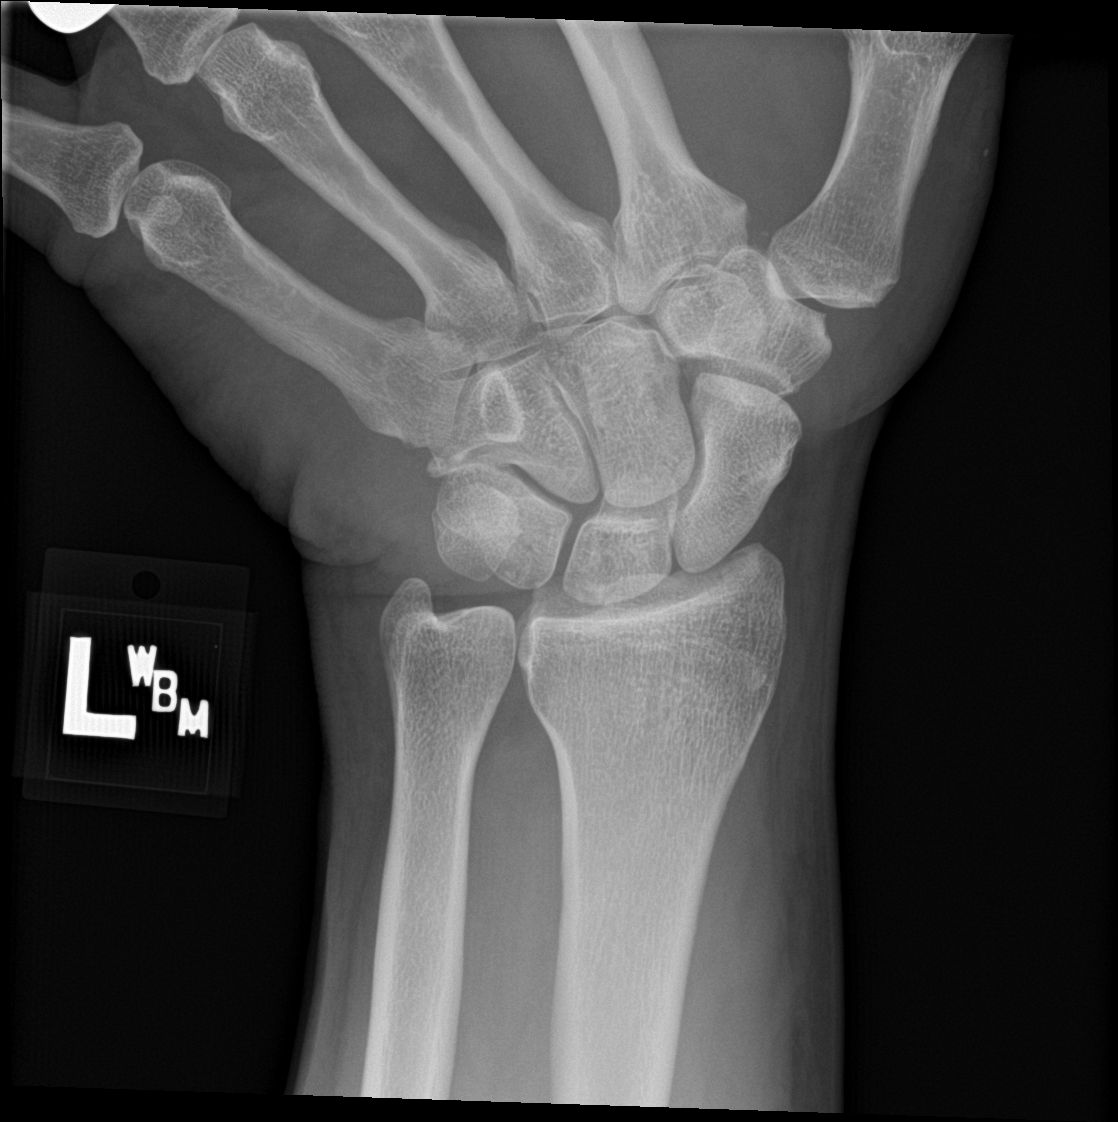

[4 of 4 positions shown; findings below may reference images not displayed]

FINDINGS: Mild positive ulnar variance. No acute bony abnormality.
Specifically, no fracture, subluxation, or dislocation. Accessory
ossicle noted adjacent the lateral margin of the hamate. Normal bone
mineralization. No worrisome lytic or blastic lesions. Soft tissues
are unremarkable. Mild triscaphe arthrosis.
IMPRESSION: No acute osseous or soft tissue abnormality.

Slight positive ulnar variance.

Mild triscaphe arthrosis.

## 2022-05-05 ENCOUNTER — Ambulatory Visit: Payer: Self-pay

## 2022-05-05 NOTE — Telephone Encounter (Signed)
  Chief Complaint: Puncture wound from rusty nail Symptoms: above Frequency: yesterday Pertinent Negatives: Patient denies  Disposition: ED /[x] Urgent Care (no appt availability in office) / Appointment(In office/virtual)/  [facility]/ Home Care/ Refused Recommended Disposition /[]  Mobile Bus/  Follow-up with PCP Additional Notes: Pt stepped on a rusty nail yesterday and punctured his foot. Minimal bleeding. Unsure when last tetanus shot was. Pt will go to UC for care.    Summary: Rusty Nail   Stepped on a rusty nail yesterday left foot.  (No pain) Questions does he need a tetanus shot, also he states his wife is a Engineer, civil (consulting).   No appt available today  ----- Message from Cory Munch sent at 05/05/2022 12:11 PM EDT ----- Stepped on a rusty nail yesterday left foot.  (No pain) Questions does he need a tetanus shot, also he states his wife is a Engineer, civil (consulting).   No appt available today  ----- Message from Cory Munch sent at 05/05/2022 12:10 PM EDT ----- Stepped on a rusty nail yesterday left foot.  (No pain) Questions does he need a tetanus shot, also he states his wife is a Engineer, civil (consulting).   No appt available today     Reason for Disposition  Puncture wound from a sharp object that was very dirty  Answer Assessment - Initial Assessment Questions 1. LOCATION: "Where is the puncture located?"      Left  2. OBJECT: "What was the object that punctured the skin?"      Rusty nail 3. DEPTH: "How deep do you think the puncture goes?"      Not too deep 4. ONSET: "When did the injury occur?" (Minutes or hours)     yesterday 5. PAIN: "Is it painful?" If Yes, ask: "How bad is the pain?"  (Scale 1-10; or mild, moderate, severe)     *No Answer* 6. TETANUS: "When was the last tetanus booster?"     Unsure  Protocols used: Puncture Wound-A-AH

## 2022-06-23 ENCOUNTER — Other Ambulatory Visit: Payer: Self-pay | Admitting: Family Medicine

## 2022-06-23 ENCOUNTER — Telehealth: Payer: Self-pay | Admitting: Family Medicine

## 2022-06-23 DIAGNOSIS — Z0000 Encounter for general adult medical examination without abnormal findings: Secondary | ICD-10-CM

## 2022-06-23 DIAGNOSIS — Z131 Encounter for screening for diabetes mellitus: Secondary | ICD-10-CM

## 2022-06-23 DIAGNOSIS — E781 Pure hyperglyceridemia: Secondary | ICD-10-CM

## 2022-06-23 DIAGNOSIS — Z125 Encounter for screening for malignant neoplasm of prostate: Secondary | ICD-10-CM

## 2022-06-23 DIAGNOSIS — K219 Gastro-esophageal reflux disease without esophagitis: Secondary | ICD-10-CM

## 2022-06-23 DIAGNOSIS — E669 Obesity, unspecified: Secondary | ICD-10-CM

## 2022-06-23 DIAGNOSIS — G40909 Epilepsy, unspecified, not intractable, without status epilepticus: Secondary | ICD-10-CM

## 2022-06-23 NOTE — Telephone Encounter (Signed)
Called pt to make an appt. Pt states he needs blood work done. Please order any labs needed and call back pt to schedule appt.

## 2022-06-23 NOTE — Telephone Encounter (Signed)
Please call patient to assist with scheduling lab only visit then annual physical 1 week later.  He requested med refill and also asked to schedule but has not been scheduled yet.  His labs are already ordered in the system.  Saralyn Pilar, DO [facility] 06/23/2022, 5:44 PM

## 2022-06-23 NOTE — Telephone Encounter (Signed)
Requested medications are due for refill today.  yes  Requested medications are on the active medications list.  yes  Last refill. 524/2023 #90 3 rf  Future visit scheduled.   no  Notes to clinic.  Called pt to make an appt. Pt states he needs blood work done. Please order any labs needed and call back pt to schedule appt.   Requested Prescriptions  Pending Prescriptions Disp Refills   pantoprazole (PROTONIX) 40 MG tablet [Pharmacy Med Name: Pantoprazole Sodium 40 MG Oral Tablet Delayed Release] 90 tablet 0    Sig: Take 1 tablet by mouth once daily     Gastroenterology: Proton Pump Inhibitors Failed - 06/23/2022  9:33 AM      Failed - Valid encounter within last 12 months    Recent Outpatient Visits           1 year ago Annual physical exam   [place] [facility] Smitty Cords, DO   1 year ago Annual physical exam   [place] [facility] Smitty Cords, DO   1 year ago Seizure disorder [facility])   [facility] [facility] [place], [place], [place]

## 2022-06-27 NOTE — Telephone Encounter (Signed)
I called the patient and scheduled the appt.

## 2022-06-28 ENCOUNTER — Other Ambulatory Visit: Payer: BC Managed Care – PPO

## 2022-06-28 DIAGNOSIS — E669 Obesity, unspecified: Secondary | ICD-10-CM

## 2022-06-28 DIAGNOSIS — Z125 Encounter for screening for malignant neoplasm of prostate: Secondary | ICD-10-CM

## 2022-06-28 DIAGNOSIS — Z0000 Encounter for general adult medical examination without abnormal findings: Secondary | ICD-10-CM

## 2022-06-28 DIAGNOSIS — G40909 Epilepsy, unspecified, not intractable, without status epilepticus: Secondary | ICD-10-CM

## 2022-06-28 DIAGNOSIS — E66811 Obesity, class 1: Secondary | ICD-10-CM

## 2022-06-28 DIAGNOSIS — Z131 Encounter for screening for diabetes mellitus: Secondary | ICD-10-CM

## 2022-06-28 DIAGNOSIS — E781 Pure hyperglyceridemia: Secondary | ICD-10-CM

## 2022-06-29 LAB — CBC WITH DIFFERENTIAL/PLATELET
Absolute Monocytes: 312 cells/uL (ref 200–950)
Basophils Absolute: 62 cells/uL (ref 0–200)
Basophils Relative: 1.5 %
Eosinophils Absolute: 90 cells/uL (ref 15–500)
Eosinophils Relative: 2.2 %
HCT: 49.5 % (ref 38.5–50.0)
Hemoglobin: 16.3 g/dL (ref 13.2–17.1)
Lymphs Abs: 1615 cells/uL (ref 850–3900)
MCH: 28.8 pg (ref 27.0–33.0)
MCHC: 32.9 g/dL (ref 32.0–36.0)
MCV: 87.6 fL (ref 80.0–100.0)
MPV: 9.9 fL (ref 7.5–12.5)
Monocytes Relative: 7.6 %
Neutro Abs: 2021 cells/uL (ref 1500–7800)
Neutrophils Relative %: 49.3 %
Platelets: 183 10*3/uL (ref 140–400)
RBC: 5.65 10*6/uL (ref 4.20–5.80)
RDW: 12.7 % (ref 11.0–15.0)
Total Lymphocyte: 39.4 %
WBC: 4.1 10*3/uL (ref 3.8–10.8)

## 2022-06-29 LAB — COMPLETE METABOLIC PANEL WITH GFR
AG Ratio: 1.6 (calc) (ref 1.0–2.5)
ALT: 65 U/L — ABNORMAL HIGH (ref 9–46)
AST: 25 U/L (ref 10–35)
Albumin: 4.1 g/dL (ref 3.6–5.1)
Alkaline phosphatase (APISO): 66 U/L (ref 35–144)
BUN: 21 mg/dL (ref 7–25)
CO2: 26 mmol/L (ref 20–32)
Calcium: 8.5 mg/dL — ABNORMAL LOW (ref 8.6–10.3)
Chloride: 104 mmol/L (ref 98–110)
Creat: 0.94 mg/dL (ref 0.70–1.30)
Globulin: 2.5 g/dL (calc) (ref 1.9–3.7)
Glucose, Bld: 95 mg/dL (ref 65–99)
Potassium: 4.1 mmol/L (ref 3.5–5.3)
Sodium: 139 mmol/L (ref 135–146)
Total Bilirubin: 0.4 mg/dL (ref 0.2–1.2)
Total Protein: 6.6 g/dL (ref 6.1–8.1)
eGFR: 97 mL/min/{1.73_m2} (ref >=60)

## 2022-06-29 LAB — HEMOGLOBIN A1C
Hgb A1c MFr Bld: 5.5 % of total Hgb (ref <5.7)
Mean Plasma Glucose: 111 mg/dL
eAG (mmol/L): 6.2 mmol/L

## 2022-06-29 LAB — LIPID PANEL
Cholesterol: 260 mg/dL — ABNORMAL HIGH (ref <200)
HDL: 40 mg/dL (ref >=40)
LDL Cholesterol (Calc): 185 mg/dL (calc) — ABNORMAL HIGH
Non-HDL Cholesterol (Calc): 220 mg/dL (calc) — ABNORMAL HIGH (ref <130)
Total CHOL/HDL Ratio: 6.5 (calc) — ABNORMAL HIGH (ref <5.0)
Triglycerides: 176 mg/dL — ABNORMAL HIGH (ref <150)

## 2022-06-29 LAB — CARBAMAZEPINE LEVEL, TOTAL: Carbamazepine Lvl: 4.3 mg/L (ref 4.0–12.0)

## 2022-06-29 LAB — TSH: TSH: 2.16 mIU/L (ref 0.40–4.50)

## 2022-06-29 LAB — PSA: PSA: 0.39 ng/mL (ref <=4.00)

## 2022-07-05 ENCOUNTER — Ambulatory Visit (INDEPENDENT_AMBULATORY_CARE_PROVIDER_SITE_OTHER): Payer: BC Managed Care – PPO | Admitting: Family Medicine

## 2022-07-05 ENCOUNTER — Encounter: Payer: Self-pay | Admitting: Family Medicine

## 2022-07-05 VITALS — BP 124/84 | HR 74 | Ht 70.0 in | Wt 204.0 lb

## 2022-07-05 DIAGNOSIS — G40909 Epilepsy, unspecified, not intractable, without status epilepticus: Secondary | ICD-10-CM | POA: Diagnosis not present

## 2022-07-05 DIAGNOSIS — B360 Pityriasis versicolor: Secondary | ICD-10-CM | POA: Diagnosis not present

## 2022-07-05 DIAGNOSIS — K219 Gastro-esophageal reflux disease without esophagitis: Secondary | ICD-10-CM

## 2022-07-05 DIAGNOSIS — Z0000 Encounter for general adult medical examination without abnormal findings: Secondary | ICD-10-CM

## 2022-07-05 MED ORDER — PANTOPRAZOLE SODIUM 40 MG PO TBEC: 40.0000 mg | DELAYED_RELEASE_TABLET | Freq: Every day | ORAL | 3 refills | Status: DC

## 2022-07-05 MED ORDER — FLUCONAZOLE 150 MG PO TABS
300.0000 mg | ORAL_TABLET | ORAL | 1 refills | Status: DC
Start: 2022-07-05 — End: 2023-10-09

## 2022-07-05 MED ORDER — CARBAMAZEPINE 200 MG PO TABS
200.0000 mg | ORAL_TABLET | Freq: Three times a day (TID) | ORAL | 3 refills | Status: DC
Start: 2022-07-05 — End: 2023-10-09

## 2022-07-05 NOTE — Progress Notes (Signed)
Subjective:    Patient ID: Vincent Padilla, male    DOB: Aug 07, 1970, 52 y.o.   MRN: [id number]  Vincent Padilla is a 52 y.o. male presenting on 07/05/2022 for Annual Exam   HPI  Here for Annual Physical and Lab Review.   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 He has been on Carbamazepine 200mg  TID since 1990s, he last had seizure in 97, and they attempted to take him on lower dose in past. He has opted to continue dosing and last level carbamazepine was low in 2020, prior PCP managing, he opted to not lower dose. Not interested in Neurologist has been >20 years since seen one. He needs refills authorized today. No breakthrough seizures now.   GERD H Pylori Followed by GI Dr Adela Ports on Pantoprazole 40mg  doing well, ran out recently had worse symptoms, improved back on med.   HYPERLIPIDEMIA: - Reports concerns. Last lipid panel 06/2022, TG down from 725 down to 176 Improved diet lifestyle, now carnivore diet. Low or no carb No known fam history but could have HyperTG in fam just not aware Requesting Non contrast abdominal MR Hepatic steatosis and visceral fat   BMI >29 His goal is to keep working on improving diet, reduce portions He is working with Systems analyst for exercise now [employer] loss 40 lbs in 1 year      Health Maintenance:   Shingirx due, he declines now.   Prostate CA Screening 0.30 (06/2020), due for PSA.   Updated Colonoscopy 02/26/20, next due in 5 years after polyps, 2027.   Tdap today     07/05/2022    1:26 PM 06/08/2021    1:44 PM 07/05/2020    2:06 PM  Depression screen PHQ 2/9  Decreased Interest 1 2 2   Down, Depressed, Hopeless 1 0 0  PHQ - 2 Score 2 2 2   Altered sleeping 0 0 0  Tired, decreased energy 0 0 0   Change in appetite 0 0 0  Feeling bad or failure about yourself  1 1 1   Trouble concentrating 1 0 0  Moving slowly or fidgety/restless 0 0 0  Suicidal thoughts 0 0 0  PHQ-9 Score 4 3 3   Difficult doing work/chores  Not difficult at all Not difficult at all    Past Medical History:  Diagnosis Date   Arthritis    Present to neck   Chicken pox    COVID-19    Elbow pain 10/05/2017   Frequent headaches    Hyperlipidemia 06/08/2016   Hypertriglyceridemia    Migraine    Seizure disorder (HCC) 05/22/2014   Past Surgical History:  Procedure Laterality Date   BIOPSY N/A 02/26/2020   Procedure: BIOPSY;  Surgeon: Midge Minium, MD;  Location: [facility];  Service: Endoscopy;  Laterality: N/A;   COLONOSCOPY     COLONOSCOPY WITH PROPOFOL N/A 02/26/2020   Procedure: COLONOSCOPY WITH PROPOFOL;  Surgeon: Midge Minium, MD;  Location: [facility];  Service: Endoscopy;  Laterality: N/A;  priority 4 COVID + 02-04-20   ESOPHAGOGASTRODUODENOSCOPY (EGD) WITH PROPOFOL N/A 02/26/2020   Procedure: ESOPHAGOGASTRODUODENOSCOPY (EGD) WITH PROPOFOL;  Surgeon: Midge Minium, MD;  Location: [facility];  Service: Endoscopy;  Laterality: N/A;   POLYPECTOMY N/A 02/26/2020   Procedure:  POLYPECTOMY;  Surgeon: Midge Minium, MD;  Location: [facility];  Service: Endoscopy;  Laterality: N/A;   Social History   Socioeconomic History   Marital status: Married    Spouse name: Not on file   Number of children: Not on file   Years of education: Not on file   Highest education level: Not on file  Occupational History   Not on file  Tobacco Use   Smoking status: Never   Smokeless tobacco: Never  Substance and Sexual Activity   Alcohol use: Yes    Comment: maybe one drink in a month   Drug use: Never   Sexual activity: Not on file  Other Topics Concern   Not on file  Social History Narrative   Married.   Works as Orthoptist.   Completed some college.   Enjoys Naval architect guns,  black smithing, working on cars.   Social Determinants of Health   Financial Resource Strain: Not on file  Food Insecurity: Not on file  Transportation Needs: Not on file  Physical Activity: Not on file  Stress: Not on file  Social Connections: Not on file  Intimate Partner Violence: Not on file   Family History  Problem Relation Age of Onset   Cancer Father    Cancer Brother 79       colon, deceased   Colon cancer Brother    Cancer Paternal Grandfather    Heart attack Cousin    Heart attack Cousin    Current Outpatient Medications on File Prior to Visit  Medication Sig   Omega-3 Fatty Acids (FISH OIL) 1000 MG CAPS Take 2,000 mg by mouth daily with supper.   Thiamine HCl (VITAMIN B-1 PO) Take by mouth.   No current facility-administered medications on file prior to visit.    Review of Systems  Constitutional:  Negative for activity change, appetite change, chills, diaphoresis, fatigue and fever.  HENT:  Negative for congestion and hearing loss.   Eyes:  Negative for visual disturbance.  Respiratory:  Negative for cough, chest tightness, shortness of breath and wheezing.   Cardiovascular:  Negative for chest pain, palpitations and leg swelling.  Gastrointestinal:  Negative for abdominal pain, constipation, diarrhea, nausea and vomiting.  Genitourinary:  Negative for dysuria, frequency and hematuria.  Musculoskeletal:  Negative for arthralgias and neck pain.  Skin:  Negative for rash.  Neurological:  Negative for dizziness, weakness, light-headedness, numbness and headaches.  Hematological:  Negative for adenopathy.  Psychiatric/Behavioral:  Negative for behavioral problems, dysphoric mood and sleep disturbance.    Per HPI unless specifically indicated above      Objective:    BP 124/84   Pulse 74   Ht 5\' 10"  (1.778 m)   Wt 204 lb (92.5 kg)   SpO2 95%   BMI 29.27 kg/m   Wt Readings from Last 3 Encounters:  07/05/22 204 lb (92.5 kg)  06/08/21 242 lb 6.4 oz (110  kg)  10/04/20 224 lb (101.6 kg)    Physical Exam Vitals and nursing note reviewed.  Constitutional:      General: He is not in acute distress.    Appearance: He is well-developed. He is not diaphoretic.     Comments: Well-appearing, comfortable, cooperative  HENT:     Head: Normocephalic and atraumatic.  Eyes:     General:        Right eye: No discharge.        Left eye: No discharge.     Conjunctiva/sclera: Conjunctivae normal.  Pupils: Pupils are equal, round, and reactive to light.  Neck:     Thyroid: No thyromegaly.  Cardiovascular:     Rate and Rhythm: Normal rate and regular rhythm.     Pulses: Normal pulses.     Heart sounds: Normal heart sounds. No murmur heard. Pulmonary:     Effort: Pulmonary effort is normal. No respiratory distress.     Breath sounds: Normal breath sounds. No wheezing or rales.  Abdominal:     General: Bowel sounds are normal. There is no distension.     Palpations: Abdomen is soft. There is no mass.     Tenderness: There is no abdominal tenderness.  Musculoskeletal:        General: No tenderness. Normal range of motion.     Cervical back: Normal range of motion and neck supple.     Comments: Upper / Lower Extremities: - Normal muscle tone, strength bilateral upper extremities 5/5, lower extremities 5/5  Lymphadenopathy:     Cervical: No cervical adenopathy.  Skin:    General: Skin is warm and dry.     Findings: Rash (hypopigmented patchy splotchy rash upper body trunk L side worse and some back) present. No erythema.  Neurological:     Mental Status: He is alert and oriented to person, place, and time.     Comments: Distal sensation intact to light touch all extremities  Psychiatric:        Mood and Affect: Mood normal.        Behavior: Behavior normal.        Thought Content: Thought content normal.     Comments: Well groomed, good eye contact, normal speech and thoughts       Results for orders placed or performed in visit on  06/28/22  Carbamazepine Level (Tegretol), total  Result Value Ref Range   Carbamazepine Lvl 4.3 4.0 - 12.0 mg/L  TSH  Result Value Ref Range   TSH 2.16 0.40 - 4.50 mIU/L  PSA  Result Value Ref Range   PSA 0.39 < OR = 4.00 ng/mL  Hemoglobin A1c  Result Value Ref Range   Hgb A1c MFr Bld 5.5 <5.7 % of total Hgb   Mean Plasma Glucose 111 mg/dL   eAG (mmol/L) 6.2 mmol/L  Lipid panel  Result Value Ref Range   Cholesterol 260 (H) <200 mg/dL   HDL 40 > OR = 40 mg/dL   Triglycerides 161 (H) <150 mg/dL   LDL Cholesterol (Calc) 185 (H) mg/dL (calc)   Total CHOL/HDL Ratio 6.5 (H) <5.0 (calc)   Non-HDL Cholesterol (Calc) 220 (H) <130 mg/dL (calc)  CBC with Differential/Platelet  Result Value Ref Range   WBC 4.1 3.8 - 10.8 Thousand/uL   RBC 5.65 4.20 - 5.80 Million/uL   Hemoglobin 16.3 13.2 - 17.1 g/dL   HCT 09.6 04.5 - 40.9 %   MCV 87.6 80.0 - 100.0 fL   MCH 28.8 27.0 - 33.0 pg   MCHC 32.9 32.0 - 36.0 g/dL   RDW 81.1 91.4 - 78.2 %   Platelets 183 140 - 400 Thousand/uL   MPV 9.9 7.5 - 12.5 fL   Neutro Abs 2,021 1,500 - 7,800 cells/uL   Lymphs Abs 1,615 850 - 3,900 cells/uL   Absolute Monocytes 312 200 - 950 cells/uL   Eosinophils Absolute 90 15 - 500 cells/uL   Basophils Absolute 62 0 - 200 cells/uL   Neutrophils Relative % 49.3 %   Total Lymphocyte 39.4 %   Monocytes Relative 7.6 %  Eosinophils Relative 2.2 %   Basophils Relative 1.5 %  COMPLETE METABOLIC PANEL WITH GFR  Result Value Ref Range   Glucose, Bld 95 65 - 99 mg/dL   BUN 21 7 - 25 mg/dL   Creat 1.61 0.96 - 0.45 mg/dL   eGFR 97 > OR = 60 WU/JWJ/1.91Y7   BUN/Creatinine Ratio SEE NOTE: 6 - 22 (calc)   Sodium 139 135 - 146 mmol/L   Potassium 4.1 3.5 - 5.3 mmol/L   Chloride 104 98 - 110 mmol/L   CO2 26 20 - 32 mmol/L   Calcium 8.5 (L) 8.6 - 10.3 mg/dL   Total Protein 6.6 6.1 - 8.1 g/dL   Albumin 4.1 3.6 - 5.1 g/dL   Globulin 2.5 1.9 - 3.7 g/dL (calc)   AG Ratio 1.6 1.0 - 2.5 (calc)   Total Bilirubin 0.4 0.2 - 1.2  mg/dL   Alkaline phosphatase (APISO) 66 35 - 144 U/L   AST 25 10 - 35 U/L   ALT 65 (H) 9 - 46 U/L      Assessment & Plan:   Problem List Items Addressed This Visit     Gastroesophageal reflux disease   Relevant Medications   pantoprazole (PROTONIX) 40 MG tablet   Seizure disorder (HCC)    Chronic seizure disorder No breakthrough seizures >20 years Currently stable on dose Carbamazepine      Relevant Medications   carbamazepine (TEGRETOL) 200 MG tablet   Other Visit Diagnoses     Annual physical exam    -  Primary   Tinea versicolor       Relevant Medications   fluconazole (DIFLUCAN) 150 MG tablet       Updated Health Maintenance information Reviewed recent lab results with patient Encouraged improvement to lifestyle with diet and exercise Goal of weight loss   We can repeat again in 6 months.  LDL is elevated in 180s Offer statin therapy for elevated ASCVD The 10-year ASCVD risk score (Arnett DK, et al., 2019) is: 7.5% He declines at this time, prefers to avoid rx statin therapy  We will repeat Lipid, C Peptide, Insulin level, A1c  We will refer you to imaging for MR Abdomen WO Contrast for hepatic steatosis / visceral fat evaluation - stay tuned for scheduling.  -----------------------------  Tinea Versicolor seems to be the rash you have. I would suggest Diflucan 150mg  x 2 = 300mg  weekly for 2 weeks   Meds ordered this encounter  Medications   fluconazole (DIFLUCAN) 150 MG tablet    Sig: Take 2 tablets (300 mg total) by mouth once a week. For 2 weeks.    Dispense:  4 tablet    Refill:  1   carbamazepine (TEGRETOL) 200 MG tablet    Sig: Take 1 tablet (200 mg total) by mouth 3 (three) times daily.    Dispense:  270 tablet    Refill:  3    Add extra refills for 1 year supply.   pantoprazole (PROTONIX) 40 MG tablet    Sig: Take 1 tablet (40 mg total) by mouth daily.    Dispense:  90 tablet    Refill:  3      Follow up plan: Return in about 6  months (around 01/04/2023) for 6 month fasting lab only then 1 week later Follow-up Lab results, Cholesterol.  Future labs insulin, c peptide, lipid, A1c CMET  MR Abdomen WO Contrast for future - prefer [place]  Saralyn Pilar, DO [facility] 07/05/2022, 1:27 PM

## 2022-07-05 NOTE — Patient Instructions (Addendum)
Thank you for coming to the office today.  Recent Labs    06/28/22 0905  HGBA1C 5.5   We can repeat again in 6 months.  LDL is elevated in 180s  We will repeat Lipid, C Peptide, Insulin level, A1c  We will refer you to imaging for MR Abdomen WO Contrast - stay tuned for scheduling.  -----------------------------  Tinea Versicolor seems to be the rash you have. I would suggest Diflucan 150mg  x 2 = 300mg  weekly for 2 weeks    DUE for FASTING BLOOD WORK (no food or drink after midnight before the lab appointment, only water or coffee without cream/sugar on the morning of)  SCHEDULE "Lab Only" visit in the morning at the clinic for lab draw in 6 MONTHS   - Make sure Lab Only appointment is at about 1 week before your next appointment, so that results will be available  For Lab Results, once available within 2-3 days of blood draw, you can can log in to MyChart online to view your results and a brief explanation. Also, we can discuss results at next follow-up visit.    Please schedule a Follow-up Appointment to: Return in about 6 months (around 01/04/2023) for 6 month fasting lab only then 1 week later Follow-up Lab results, Cholesterol.  If you have any other questions or concerns, please feel free to call the office or send a message through MyChart. You may also schedule an earlier appointment if necessary.  Additionally, you may be receiving a survey about your experience at our office within a few days to 1 week by e-mail or mail. We value your feedback.  Saralyn Pilar, DO [facility], [place]

## 2022-07-06 ENCOUNTER — Other Ambulatory Visit: Payer: Self-pay | Admitting: Family Medicine

## 2022-07-06 DIAGNOSIS — E781 Pure hyperglyceridemia: Secondary | ICD-10-CM

## 2022-07-06 DIAGNOSIS — K760 Fatty (change of) liver, not elsewhere classified: Secondary | ICD-10-CM

## 2022-07-06 DIAGNOSIS — R7309 Other abnormal glucose: Secondary | ICD-10-CM

## 2022-07-06 DIAGNOSIS — R7401 Elevation of levels of liver transaminase levels: Secondary | ICD-10-CM

## 2022-07-06 DIAGNOSIS — E669 Obesity, unspecified: Secondary | ICD-10-CM

## 2022-07-06 NOTE — Assessment & Plan Note (Signed)
Chronic seizure disorder No breakthrough seizures >20 years Currently stable on dose Carbamazepine

## 2022-07-06 NOTE — Addendum Note (Signed)
Addended by: Smitty Cords on: 07/06/2022 07:12 AM   Modules accepted: Orders

## 2022-07-06 NOTE — Addendum Note (Signed)
Addended by: Smitty Cords on: 07/06/2022 07:08 AM   Modules accepted: Orders

## 2023-01-18 ENCOUNTER — Other Ambulatory Visit: Payer: BC Managed Care – PPO

## 2023-01-18 DIAGNOSIS — E66811 Obesity, class 1: Secondary | ICD-10-CM | POA: Diagnosis not present

## 2023-01-18 DIAGNOSIS — E781 Pure hyperglyceridemia: Secondary | ICD-10-CM | POA: Diagnosis not present

## 2023-01-18 DIAGNOSIS — R7401 Elevation of levels of liver transaminase levels: Secondary | ICD-10-CM | POA: Diagnosis not present

## 2023-01-18 DIAGNOSIS — K760 Fatty (change of) liver, not elsewhere classified: Secondary | ICD-10-CM

## 2023-01-18 DIAGNOSIS — R7309 Other abnormal glucose: Secondary | ICD-10-CM

## 2023-01-19 LAB — HEMOGLOBIN A1C
Hgb A1c MFr Bld: 5.2 %{Hb} (ref <5.7)
Mean Plasma Glucose: 103 mg/dL
eAG (mmol/L): 5.7 mmol/L

## 2023-01-19 LAB — LIPID PANEL
Cholesterol: 243 mg/dL — ABNORMAL HIGH (ref <200)
HDL: 59 mg/dL (ref >=40)
LDL Cholesterol (Calc): 159 mg/dL — ABNORMAL HIGH
Non-HDL Cholesterol (Calc): 184 mg/dL — ABNORMAL HIGH (ref <130)
Total CHOL/HDL Ratio: 4.1 (calc) (ref <5.0)
Triglycerides: 129 mg/dL (ref <150)

## 2023-01-19 LAB — COMPLETE METABOLIC PANEL WITH GFR
AG Ratio: 1.8 (calc) (ref 1.0–2.5)
ALT: 31 U/L (ref 9–46)
AST: 17 U/L (ref 10–35)
Albumin: 4.3 g/dL (ref 3.6–5.1)
Alkaline phosphatase (APISO): 59 U/L (ref 35–144)
BUN: 19 mg/dL (ref 7–25)
CO2: 26 mmol/L (ref 20–32)
Calcium: 8.7 mg/dL (ref 8.6–10.3)
Chloride: 104 mmol/L (ref 98–110)
Creat: 0.78 mg/dL (ref 0.70–1.30)
Globulin: 2.4 g/dL (ref 1.9–3.7)
Glucose, Bld: 100 mg/dL — ABNORMAL HIGH (ref 65–99)
Potassium: 4.2 mmol/L (ref 3.5–5.3)
Sodium: 138 mmol/L (ref 135–146)
Total Bilirubin: 0.5 mg/dL (ref 0.2–1.2)
Total Protein: 6.7 g/dL (ref 6.1–8.1)
eGFR: 106 mL/min/{1.73_m2} (ref >=60)

## 2023-01-19 LAB — C-PEPTIDE: C-Peptide: 1.46 ng/mL (ref 0.80–3.85)

## 2023-01-19 LAB — INSULIN, RANDOM: Insulin: 4.3 u[IU]/mL

## 2023-01-25 ENCOUNTER — Ambulatory Visit: Payer: BC Managed Care – PPO | Admitting: Family Medicine

## 2023-10-09 ENCOUNTER — Encounter: Payer: Self-pay | Admitting: Family Medicine

## 2023-10-09 ENCOUNTER — Other Ambulatory Visit: Payer: Self-pay | Admitting: Family Medicine

## 2023-10-09 ENCOUNTER — Ambulatory Visit
Admission: RE | Admit: 2023-10-09 | Discharge: 2023-10-09 | Disposition: A | Discharge status: Home / Self Care | Source: Ambulatory Visit | Attending: Family Medicine | Admitting: Family Medicine

## 2023-10-09 ENCOUNTER — Ambulatory Visit (INDEPENDENT_AMBULATORY_CARE_PROVIDER_SITE_OTHER): Payer: Self-pay | Admitting: Family Medicine

## 2023-10-09 VITALS — BP 122/78 | HR 59 | Ht 70.0 in | Wt 209.4 lb

## 2023-10-09 DIAGNOSIS — E66811 Obesity, class 1: Secondary | ICD-10-CM

## 2023-10-09 DIAGNOSIS — S6992XD Unspecified injury of left wrist, hand and finger(s), subsequent encounter: Secondary | ICD-10-CM | POA: Diagnosis not present

## 2023-10-09 DIAGNOSIS — M79642 Pain in left hand: Secondary | ICD-10-CM | POA: Diagnosis not present

## 2023-10-09 DIAGNOSIS — K219 Gastro-esophageal reflux disease without esophagitis: Secondary | ICD-10-CM

## 2023-10-09 DIAGNOSIS — E781 Pure hyperglyceridemia: Secondary | ICD-10-CM

## 2023-10-09 DIAGNOSIS — G40909 Epilepsy, unspecified, not intractable, without status epilepticus: Secondary | ICD-10-CM

## 2023-10-09 DIAGNOSIS — Z79899 Other long term (current) drug therapy: Secondary | ICD-10-CM

## 2023-10-09 DIAGNOSIS — R7401 Elevation of levels of liver transaminase levels: Secondary | ICD-10-CM

## 2023-10-09 DIAGNOSIS — Z0000 Encounter for general adult medical examination without abnormal findings: Secondary | ICD-10-CM

## 2023-10-09 DIAGNOSIS — K760 Fatty (change of) liver, not elsewhere classified: Secondary | ICD-10-CM

## 2023-10-09 DIAGNOSIS — R7309 Other abnormal glucose: Secondary | ICD-10-CM

## 2023-10-09 DIAGNOSIS — B360 Pityriasis versicolor: Secondary | ICD-10-CM

## 2023-10-09 DIAGNOSIS — M79645 Pain in left finger(s): Secondary | ICD-10-CM | POA: Diagnosis not present

## 2023-10-09 DIAGNOSIS — Z125 Encounter for screening for malignant neoplasm of prostate: Secondary | ICD-10-CM

## 2023-10-09 MED ORDER — PANTOPRAZOLE SODIUM 40 MG PO TBEC: 40.0000 mg | DELAYED_RELEASE_TABLET | Freq: Every day | ORAL | 3 refills | Status: AC

## 2023-10-09 MED ORDER — CARBAMAZEPINE 200 MG PO TABS: 200.0000 mg | ORAL_TABLET | Freq: Three times a day (TID) | ORAL | 3 refills | Status: AC

## 2023-10-09 MED ORDER — FLUCONAZOLE 150 MG PO TABS: 300.0000 mg | ORAL_TABLET | ORAL | 2 refills | Status: AC

## 2023-10-09 NOTE — Patient Instructions (Addendum)
 Thank you for coming to the office today.  Lab orders in for today  Refills for up to 1 year  Diflucan  Fluconazole  is for acute flares only of tinae versicolor rash. If you have recurrence you can repeat. I don't have other options for cure. Some patients take this preventatively one dose per month during season. We can refer to Dermatologist for evaluation if interested 2nd opinion  X-ray of Left Hand  DUE for FASTING BLOOD WORK (no food or drink after midnight before the lab appointment, only water  or coffee without cream/sugar on the morning of)  SCHEDULE Lab Only visit in the morning at the clinic for lab draw in 1 YEAR  - Make sure Lab Only appointment is at about 1 week before your next appointment, so that results will be available  For Lab Results, once available within 2-3 days of blood draw, you can can log in to MyChart online to view your results and a brief explanation. Also, we can discuss results at next follow-up visit.   Please schedule a Follow-up Appointment to: Return for 1 year fasting lab > 1 week later Annual Physical.  If you have any other questions or concerns, please feel free to call the office or send a message through MyChart. You may also schedule an earlier appointment if necessary.  Additionally, you may be receiving a survey about your experience at our office within a few days to 1 week by e-mail or mail. We value your feedback.  Marsa Officer, DO [facility], [place]

## 2023-10-09 NOTE — Progress Notes (Signed)
 Subjective:    Patient ID: Vincent Padilla, male    DOB: March 07, 1970, 53 y.o.   MRN: [id number]  Vincent Padilla is a 53 y.o. male presenting on 10/09/2023 for Annual Exam   HPI  Discussed the use of AI scribe software for clinical note transcription with the patient, who gave verbal consent to proceed.  History of Present Illness   Vincent Padilla is a 53 year old male who presents for an annual physical exam.  Tinea Versicolor - Recurrent rash, particularly during periods of sweating and heat - Partial response to fluconazole , but rash does not completely resolve  Left hand pain and injury - Sustained injury to left hand in June while using a milling machine - Deep abrasion on the palmar aspect of the left index finger - Persistent pain in another finger, worsened with writing or applying pressure - Concern for dropping objects due to pain, especially when caring for grandchildren  Dietary habits and metabolic evaluation - Follows a carnivore diet  Cerebral Palsy, Right sided. Chronic weakness right upper extremity   Seizure Disorder Onset 1986, later identified on MRA, with collapsed vessels. He has been on Carbamazepine  200mg  TID since 1990s, he last had seizure in 97, and they attempted to take him on lower dose in past. He has opted to continue dosing and last level carbamazepine  was low in 2020, prior PCP managing, he opted to not lower dose. Not interested in Neurologist has been >20 years since seen one. He needs refills authorized today. No breakthrough seizures now.   GERD H Pylori Followed by GI Dr Jinny Solo on Pantoprazole  40mg  doing well, ran out recently had worse symptoms, improved back on med.   HYPERLIPIDEMIA: - Reports concerns. Last lipid panel 01/2023 LDL down to 159 from 185. Improved TG 176 down to 129 Due for repeat lab today Improved diet lifestyle, carnivore diet. Low or no carb Hepatic steatosis and visceral fat   BMI >30 His goal is  to keep working on improving diet, reduce portions He is working with Systems analyst for exercise now     Health Maintenance:   Flu, Pneumonia vaccine, Shingirx due, he declines now.   Prostate CA Screening 0.39 (2024)   Updated Colonoscopy 02/26/20, next due in 5 years after polyps, 2027. Fam history Brother passed from Colon CA Age 24       10/09/2023    9:34 AM 07/05/2022    1:26 PM 06/08/2021    1:44 PM  Depression screen PHQ 2/9  Decreased Interest 2 1 2   Down, Depressed, Hopeless 1 1 0  PHQ - 2 Score 3 2 2   Altered sleeping 1 0 0  Tired, decreased energy 2 0 0  Change in appetite 0 0 0  Feeling bad or failure about yourself  1 1 1   Trouble concentrating 0 1 0  Moving slowly or fidgety/restless 0 0 0  Suicidal thoughts 0 0 0  PHQ-9 Score 7 4 3   Difficult doing work/chores Not difficult at all  Not difficult at all       10/09/2023    9:34 AM 07/05/2022    1:26 PM 06/08/2021    1:44 PM 07/05/2020    2:06 PM  GAD 7 : Generalized Anxiety Score  Nervous, Anxious, on Edge 1 0 0 0  Control/stop worrying 0 0 0 0  Worry too much - different things 1 0 1 1  Trouble relaxing 0 1 0 0  Restless 0 0 0  0  Easily annoyed or irritable 1 0 0 0  Afraid - awful might happen 2 0 0 0  Total GAD 7 Score 5 1 1 1   Anxiety Difficulty Not difficult at all  Not difficult at all Not difficult at all     Past Medical History:  Diagnosis Date   Arthritis    Present to neck   Chicken pox    COVID-19    Elbow pain 10/05/2017   Frequent headaches    Hyperlipidemia 06/08/2016   Hypertriglyceridemia    Migraine    Seizure disorder (HCC) 05/22/2014   Past Surgical History:  Procedure Laterality Date   BIOPSY N/A 02/26/2020   Procedure: BIOPSY;  Surgeon: Jinny Carmine, MD;  Location: [facility];  Service: Endoscopy;  Laterality: N/A;   COLONOSCOPY     COLONOSCOPY WITH PROPOFOL  N/A 02/26/2020   Procedure: COLONOSCOPY WITH PROPOFOL ;  Surgeon: Jinny Carmine, MD;  Location: [facility];  Service: Endoscopy;  Laterality: N/A;  priority 4 COVID + 02-04-20   ESOPHAGOGASTRODUODENOSCOPY (EGD) WITH PROPOFOL  N/A 02/26/2020   Procedure: ESOPHAGOGASTRODUODENOSCOPY (EGD) WITH PROPOFOL ;  Surgeon: Jinny Carmine, MD;  Location: [facility];  Service: Endoscopy;  Laterality: N/A;   POLYPECTOMY N/A 02/26/2020   Procedure: POLYPECTOMY;  Surgeon: Jinny Carmine, MD;  Location: [facility];  Service: Endoscopy;  Laterality: N/A;   Social History   Socioeconomic History   Marital status: Married    Spouse name: Not on file   Number of children: Not on file   Years of education: Not on file   Highest education level: Not on file  Occupational History   Not on file  Tobacco Use   Smoking status: Never   Smokeless tobacco: Never  Substance and Sexual Activity   Alcohol use: Yes    Comment: maybe one drink in a month   Drug use: Never   Sexual activity: Not on file  Other Topics Concern   Not on file  Social History Narrative   Married.   Works as Orthoptist.   Completed some college.   Enjoys Naval architect guns, black smithing, working on cars.   Social Drivers of Corporate investment banker Strain: Not on file  Food Insecurity: Not on file  Transportation Needs: Not on file  Physical Activity: Not on file  Stress: Not on file  Social Connections: Not on file  Intimate Partner Violence: Not on file   Family History  Problem Relation Age of Onset   Cancer Father    Cancer Brother 89       colon, deceased   Colon cancer Brother    Cancer Paternal Grandfather    Heart attack Cousin    Heart attack Cousin    Current Outpatient Medications on File Prior to Visit  Medication Sig   Omega-3 Fatty Acids (FISH OIL) 1000 MG CAPS Take 2,000 mg by mouth daily with supper.   Thiamine HCl (VITAMIN B-1 PO) Take by mouth.   No current facility-administered medications on file prior to visit.    Review of Systems  Constitutional:  Negative for activity  change, appetite change, chills, diaphoresis, fatigue and fever.  HENT:  Negative for congestion and hearing loss.   Eyes:  Negative for visual disturbance.  Respiratory:  Negative for cough, chest tightness, shortness of breath and wheezing.   Cardiovascular:  Negative for chest pain, palpitations and leg swelling.  Gastrointestinal:  Negative for abdominal pain, constipation, diarrhea, nausea and vomiting.  Genitourinary:  Negative for  dysuria, frequency and hematuria.  Musculoskeletal:  Negative for arthralgias and neck pain.  Skin:  Negative for rash.  Neurological:  Negative for dizziness, weakness, light-headedness, numbness and headaches.  Hematological:  Negative for adenopathy.  Psychiatric/Behavioral:  Negative for behavioral problems, dysphoric mood and sleep disturbance.    Per HPI unless specifically indicated above     Objective:    BP 122/78 (BP Location: Left Arm, Patient Position: Sitting, Cuff Size: Normal)   Pulse (!) 59   Ht 5' 10 (1.778 m)   Wt 209 lb 6 oz (95 kg)   SpO2 97%   BMI 30.04 kg/m   Wt Readings from Last 3 Encounters:  10/09/23 209 lb 6 oz (95 kg)  07/05/22 204 lb (92.5 kg)  06/08/21 242 lb 6.4 oz (110 kg)    Physical Exam Vitals and nursing note reviewed.  Constitutional:      General: He is not in acute distress.    Appearance: He is well-developed. He is not diaphoretic.     Comments: Well-appearing, comfortable, cooperative  HENT:     Head: Normocephalic and atraumatic.  Eyes:     General:        Right eye: No discharge.        Left eye: No discharge.     Conjunctiva/sclera: Conjunctivae normal.     Pupils: Pupils are equal, round, and reactive to light.  Neck:     Thyroid : No thyromegaly.     Vascular: No carotid bruit.  Cardiovascular:     Rate and Rhythm: Normal rate and regular rhythm.     Pulses: Normal pulses.     Heart sounds: Normal heart sounds. No murmur heard. Pulmonary:     Effort: Pulmonary effort is normal. No  respiratory distress.     Breath sounds: Normal breath sounds. No wheezing or rales.  Abdominal:     General: Bowel sounds are normal. There is no distension.     Palpations: Abdomen is soft. There is no mass.     Tenderness: There is no abdominal tenderness.  Musculoskeletal:        General: No tenderness. Normal range of motion.     Cervical back: Normal range of motion and neck supple.     Right lower leg: No edema.     Left lower leg: No edema.     Comments: Upper / Lower Extremities: - Normal muscle tone, strength bilateral upper extremities 5/5, lower extremities 5/5  Lymphadenopathy:     Cervical: No cervical adenopathy.  Skin:    General: Skin is warm and dry.     Findings: No erythema or rash.  Neurological:     Mental Status: He is alert and oriented to person, place, and time.     Comments: Distal sensation intact to light touch all extremities  Psychiatric:        Mood and Affect: Mood normal.        Behavior: Behavior normal.        Thought Content: Thought content normal.     Comments: Well groomed, good eye contact, normal speech and thoughts     I have personally reviewed the radiology report from 02/19/20 on CT Cardiac Morphology Calcium Score.  ADDENDUM REPORT: 02/20/2020 08:00   EXAM: OVER-READ INTERPRETATION  CT CHEST   The following report is an over-read performed by radiologist Dr. Selinda Saas [facility] on 02/20/2020. This over-read does not include interpretation of cardiac or coronary anatomy or pathology. The coronary CTA interpretation  by the cardiologist is attached.   COMPARISON:  None.   FINDINGS: Cardiovascular: Top-normal heart size. No significant pericardial effusion/thickening. Great vessels are normal in course and caliber. No central pulmonary emboli.   Mediastinum/Nodes: Unremarkable esophagus. No pathologically enlarged mediastinal or hilar lymph nodes.   Lungs/Pleura: No pneumothorax. No pleural effusion. No  acute consolidative airspace disease, lung masses or significant pulmonary nodules.   Upper abdomen: No acute abnormality.   Musculoskeletal: No aggressive appearing focal osseous lesions. Mild thoracic spondylosis.   IMPRESSION: No significant extracardiac findings.     Electronically Signed   By: Selinda DELENA Blue M.D.   On: 02/20/2020 08:00    Addended by Blue Selinda Blunt, MD on 02/20/2020  8:03 AM    Study Result  Narrative & Impression  CLINICAL DATA:  Chest pain   EXAM: Cardiac/Coronary  CTA   TECHNIQUE: The patient was scanned on a Siemens Somatoform go.Top scanner.   FINDINGS: A retrospective scan was triggered in the descending thoracic aorta. Axial non-contrast 3 mm slices were carried out through the heart. The data set was analyzed on a dedicated work station and scored using the Agatson method. Gantry rotation speed was 330 msecs and collimation was .6 mm. 100mg  of metoprolol  and 0.8 mg of sl NTG was given. The 3D data set was reconstructed in 5% intervals of the 60-95 % of the R-R cycle. Diastolic phases were analyzed on a dedicated work station using MPR, MIP and VRT modes. The patient received 85 cc of contrast.   Aorta:  Normal size.  No calcifications.  No dissection.   Aortic Valve:  Trileaflet.  No calcifications.   Coronary Arteries:  Normal coronary origin.  Right dominance.   RCA is a dominant artery that gives rise to PDA and PLA. There is no plaque.   Left main gives rise to LAD and LCX arteries.  There is no plaque   LAD is a large vessel that has no plaque.   LCX is a non-dominant artery that gives rise to one OM1 branch. There is no plaque.   Other findings:   Normal pulmonary vein drainage into the left atrium.   Normal left atrial appendage without a thrombus.   Normal size of the pulmonary artery.   IMPRESSION: 1. Coronary calcium score of 0. Patient is low risk for coronary events   2. Normal coronary origin with right  dominance.   3. No evidence of CAD.   4. CAD-RADS 0. Consider non-atherosclerotic causes of chest pain.   Electronically Signed: By: Redell Cave M.D. On: 02/19/2020 16:18     Results for orders placed or performed in visit on 01/18/23  COMPLETE METABOLIC PANEL WITH GFR   Collection Time: 01/18/23  8:12 AM  Result Value Ref Range   Glucose, Bld 100 (H) 65 - 99 mg/dL   BUN 19 7 - 25 mg/dL   Creat 9.21 9.29 - 8.69 mg/dL   eGFR 893 > OR = 60 fO/fpw/8.26f7   BUN/Creatinine Ratio SEE NOTE: 6 - 22 (calc)   Sodium 138 135 - 146 mmol/L   Potassium 4.2 3.5 - 5.3 mmol/L   Chloride 104 98 - 110 mmol/L   CO2 26 20 - 32 mmol/L   Calcium 8.7 8.6 - 10.3 mg/dL   Total Protein 6.7 6.1 - 8.1 g/dL   Albumin 4.3 3.6 - 5.1 g/dL   Globulin 2.4 1.9 - 3.7 g/dL (calc)   AG Ratio 1.8 1.0 - 2.5 (calc)   Total Bilirubin 0.5 0.2 -  1.2 mg/dL   Alkaline phosphatase (APISO) 59 35 - 144 U/L   AST 17 10 - 35 U/L   ALT 31 9 - 46 U/L  Lipid panel   Collection Time: 01/18/23  8:12 AM  Result Value Ref Range   Cholesterol 243 (H) <200 mg/dL   HDL 59 > OR = 40 mg/dL   Triglycerides 870 <849 mg/dL   LDL Cholesterol (Calc) 159 (H) mg/dL (calc)   Total CHOL/HDL Ratio 4.1 <5.0 (calc)   Non-HDL Cholesterol (Calc) 184 (H) <130 mg/dL (calc)  Hemoglobin J8r   Collection Time: 01/18/23  8:12 AM  Result Value Ref Range   Hgb A1c MFr Bld 5.2 <5.7 % of total Hgb   Mean Plasma Glucose 103 mg/dL   eAG (mmol/L) 5.7 mmol/L  C-peptide   Collection Time: 01/18/23  8:12 AM  Result Value Ref Range   C-Peptide 1.46 0.80 - 3.85 ng/mL  Insulin , random   Collection Time: 01/18/23  8:12 AM  Result Value Ref Range   Insulin  4.3 uIU/mL      Assessment & Plan:   Problem List Items Addressed This Visit     Gastroesophageal reflux disease   Relevant Medications   pantoprazole  (PROTONIX ) 40 MG tablet   Hypertriglyceridemia   Relevant Orders   Lipid panel   Comprehensive metabolic panel with GFR   Obesity (BMI  30.0-34.9)   Relevant Orders   Lipid panel   TSH   Comprehensive metabolic panel with GFR   Seizure disorder (HCC)   Relevant Medications   carbamazepine  (TEGRETOL ) 200 MG tablet   Other Relevant Orders   Carbamazepine  Level (Tegretol ), total   Other Visit Diagnoses       Annual physical exam    -  Primary   Relevant Orders   Lipid panel   Hemoglobin A1c   CBC with Differential/Platelet   PSA   TSH   Comprehensive metabolic panel with GFR     Abnormal glucose       Relevant Orders   Hemoglobin A1c     Elevated ALT measurement       Relevant Orders   CBC with Differential/Platelet   Comprehensive metabolic panel with GFR     Hepatic steatosis       Relevant Orders   Lipid panel   TSH   Comprehensive metabolic panel with GFR     Screening for prostate cancer       Relevant Orders   PSA     Tinea versicolor       Relevant Medications   fluconazole  (DIFLUCAN ) 150 MG tablet     Left hand pain       Relevant Orders   DG Hand Complete Left        Updated Health Maintenance information Fasting labs today Encouraged improvement to lifestyle with diet and exercise Goal of weight loss   Left hand pain and dysfunction, status post injury Persistent pain and dysfunction in left hand post milling machine injury. Possible ligament or tendon injury. - Order x-ray of left hand to assess for fracture or structural issues. - Consider referral to hand specialist if x-ray is inconclusive. - Advise buddy taping or splinting for support.  Tinea versicolor Recurrent fungal rash, likely tinea versicolor, exacerbated by heat and sweating. Previous fluconazole  effective but not curative. Topicals less effective than oral treatment. - Prescribe fluconazole  for recurrent episodes. - Consider antifungal shampoos for prevention. - Offer referral to dermatologist for further management.  Epilepsy Well-controlled on carbamazepine  200  mg three times daily. - Prescribe carbamazepine   200 mg three times daily for one year. - Order carbamazepine  level with blood work.  Gastroesophageal reflux disease (GERD) GERD managed with pantoprazole . - Prescribe pantoprazole  for one year.  Adult Wellness Visit Annual physical examination conducted. Blood work from January reviewed. Prostate screening discussed. Declined flu, shingles, and pneumonia vaccines. Colon cancer screening due in 2027. Cardiac arterial calcification scan from 2022 showed zero calcification. Blood pressure 122/78, weight 209 lbs. - Order updated blood work including metabolic panel, blood counts, cholesterol, A1c, prostate, thyroid , and carbamazepine  level. - Schedule colon cancer screening for 2027. - Decline flu, shingles, and pneumonia vaccines. - Schedule annual physical for next year.        Orders Placed This Encounter  Procedures   DG Hand Complete Left    Standing Status:   Future    Number of Occurrences:   1    Expiration Date:   10/08/2024    Reason for Exam (SYMPTOM  OR DIAGNOSIS REQUIRED):   3 month ago injury on milling machine with Left 5th finger flexion pain, index finger residual pain    Preferred imaging location?:   [facility]   Lipid panel    Has the patient fasted?:   Yes   Hemoglobin A1c   CBC with Differential/Platelet   PSA   TSH   Comprehensive metabolic panel with GFR    Has the patient fasted?:   Yes   Carbamazepine  Level (Tegretol ), total    Meds ordered this encounter  Medications   pantoprazole  (PROTONIX ) 40 MG tablet    Sig: Take 1 tablet (40 mg total) by mouth daily.    Dispense:  90 tablet    Refill:  3   carbamazepine  (TEGRETOL ) 200 MG tablet    Sig: Take 1 tablet (200 mg total) by mouth 3 (three) times daily.    Dispense:  270 tablet    Refill:  3    Add extra refills for 1 year supply.   fluconazole  (DIFLUCAN ) 150 MG tablet    Sig: Take 2 tablets (300 mg total) by mouth once a week. For 2 weeks.    Dispense:  4 tablet    Refill:  2     Follow  up plan: Return for 1 year fasting lab > 1 week later Annual Physical.  10/08/2024   Marsa Officer, DO [facility] 10/09/2023, 9:04 AM

## 2023-10-10 ENCOUNTER — Ambulatory Visit: Payer: Self-pay | Admitting: Family Medicine

## 2023-10-10 LAB — LIPID PANEL
Cholesterol: 274 mg/dL — ABNORMAL HIGH (ref <200)
HDL: 49 mg/dL (ref >=40)
LDL Cholesterol (Calc): 175 mg/dL — ABNORMAL HIGH
Non-HDL Cholesterol (Calc): 225 mg/dL — ABNORMAL HIGH (ref <130)
Total CHOL/HDL Ratio: 5.6 (calc) — ABNORMAL HIGH (ref <5.0)
Triglycerides: 294 mg/dL — ABNORMAL HIGH (ref <150)

## 2023-10-10 LAB — COMPREHENSIVE METABOLIC PANEL WITH GFR
AG Ratio: 2.1 (calc) (ref 1.0–2.5)
ALT: 31 U/L (ref 9–46)
AST: 16 U/L (ref 10–35)
Albumin: 4.5 g/dL (ref 3.6–5.1)
Alkaline phosphatase (APISO): 53 U/L (ref 35–144)
BUN: 25 mg/dL (ref 7–25)
CO2: 26 mmol/L (ref 20–32)
Calcium: 8.6 mg/dL (ref 8.6–10.3)
Chloride: 105 mmol/L (ref 98–110)
Creat: 0.76 mg/dL (ref 0.70–1.30)
Globulin: 2.1 g/dL (ref 1.9–3.7)
Glucose, Bld: 110 mg/dL — ABNORMAL HIGH (ref 65–99)
Potassium: 4.2 mmol/L (ref 3.5–5.3)
Sodium: 138 mmol/L (ref 135–146)
Total Bilirubin: 0.3 mg/dL (ref 0.2–1.2)
Total Protein: 6.6 g/dL (ref 6.1–8.1)
eGFR: 107 mL/min/1.73m2 (ref >=60)

## 2023-10-10 LAB — CBC WITH DIFFERENTIAL/PLATELET
Absolute Lymphocytes: 1728 {cells}/uL (ref 850–3900)
Absolute Monocytes: 408 {cells}/uL (ref 200–950)
Basophils Absolute: 58 {cells}/uL (ref 0–200)
Basophils Relative: 1.2 %
Eosinophils Absolute: 120 {cells}/uL (ref 15–500)
Eosinophils Relative: 2.5 %
HCT: 46.7 % (ref 38.5–50.0)
Hemoglobin: 15.2 g/dL (ref 13.2–17.1)
MCH: 29.1 pg (ref 27.0–33.0)
MCHC: 32.5 g/dL (ref 32.0–36.0)
MCV: 89.3 fL (ref 80.0–100.0)
MPV: 10 fL (ref 7.5–12.5)
Monocytes Relative: 8.5 %
Neutro Abs: 2486 {cells}/uL (ref 1500–7800)
Neutrophils Relative %: 51.8 %
Platelets: 166 Thousand/uL (ref 140–400)
RBC: 5.23 Million/uL (ref 4.20–5.80)
RDW: 13.5 % (ref 11.0–15.0)
Total Lymphocyte: 36 %
WBC: 4.8 Thousand/uL (ref 3.8–10.8)

## 2023-10-10 LAB — PSA: PSA: 0.38 ng/mL (ref <=4.00)

## 2023-10-10 LAB — HEMOGLOBIN A1C
Hgb A1c MFr Bld: 5.6 % (ref <5.7)
Mean Plasma Glucose: 114 mg/dL
eAG (mmol/L): 6.3 mmol/L

## 2023-10-10 LAB — TSH: TSH: 2.2 m[IU]/L (ref 0.40–4.50)

## 2023-10-10 LAB — CARBAMAZEPINE LEVEL, TOTAL: Carbamazepine Lvl: 5.4 mg/L (ref 4.0–12.0)

## 2023-10-16 ENCOUNTER — Other Ambulatory Visit: Payer: Self-pay | Admitting: Family Medicine

## 2023-10-16 DIAGNOSIS — S6992XS Unspecified injury of left wrist, hand and finger(s), sequela: Secondary | ICD-10-CM

## 2023-10-16 DIAGNOSIS — M79642 Pain in left hand: Secondary | ICD-10-CM

## 2024-10-08 ENCOUNTER — Other Ambulatory Visit

## 2024-10-15 ENCOUNTER — Encounter: Admitting: Family Medicine
